# Patient Record
Sex: Female | Born: 1990 | Race: White | Hispanic: No | Marital: Married | Smoking: Never smoker
Health system: Southern US, Community
[De-identification: ages and names within clinical notes are randomized; demographics above are authoritative.]

## PROBLEM LIST (undated history)

## (undated) ENCOUNTER — Inpatient Hospital Stay (HOSPITAL_COMMUNITY): Payer: Self-pay

## (undated) DIAGNOSIS — R519 Headache, unspecified: Secondary | ICD-10-CM

## (undated) DIAGNOSIS — K219 Gastro-esophageal reflux disease without esophagitis: Secondary | ICD-10-CM

## (undated) DIAGNOSIS — T7840XA Allergy, unspecified, initial encounter: Secondary | ICD-10-CM

## (undated) DIAGNOSIS — G43909 Migraine, unspecified, not intractable, without status migrainosus: Secondary | ICD-10-CM

## (undated) DIAGNOSIS — K121 Other forms of stomatitis: Secondary | ICD-10-CM

## (undated) DIAGNOSIS — N83209 Unspecified ovarian cyst, unspecified side: Secondary | ICD-10-CM

## (undated) DIAGNOSIS — F419 Anxiety disorder, unspecified: Secondary | ICD-10-CM

## (undated) DIAGNOSIS — R51 Headache: Secondary | ICD-10-CM

## (undated) HISTORY — DX: Other forms of stomatitis: K12.1

## (undated) HISTORY — DX: Gastro-esophageal reflux disease without esophagitis: K21.9

## (undated) HISTORY — DX: Anxiety disorder, unspecified: F41.9

## (undated) HISTORY — DX: Allergy, unspecified, initial encounter: T78.40XA

## (undated) HISTORY — PX: DILATION AND CURETTAGE OF UTERUS: SHX78

## (undated) HISTORY — DX: Unspecified ovarian cyst, unspecified side: N83.209

---

## 2011-12-19 ENCOUNTER — Ambulatory Visit (INDEPENDENT_AMBULATORY_CARE_PROVIDER_SITE_OTHER): Payer: BC Managed Care – PPO | Admitting: Emergency Medicine

## 2011-12-19 VITALS — BP 96/66 | HR 68 | Temp 98.9°F | Resp 12 | Ht 65.5 in | Wt 149.0 lb

## 2011-12-19 DIAGNOSIS — J019 Acute sinusitis, unspecified: Secondary | ICD-10-CM

## 2011-12-19 DIAGNOSIS — J329 Chronic sinusitis, unspecified: Secondary | ICD-10-CM

## 2011-12-19 DIAGNOSIS — J029 Acute pharyngitis, unspecified: Secondary | ICD-10-CM

## 2011-12-19 LAB — POCT RAPID STREP A (OFFICE): Rapid Strep A Screen: NEGATIVE

## 2011-12-19 MED ORDER — AZITHROMYCIN 250 MG PO TABS: ORAL_TABLET | ORAL | Status: AC

## 2011-12-19 MED ORDER — AZELASTINE HCL 0.1 % NA SOLN: 1.0000 | Freq: Two times a day (BID) | NASAL | Status: DC

## 2011-12-19 MED ORDER — AMOXICILLIN 875 MG PO TABS: 875.0000 mg | ORAL_TABLET | Freq: Two times a day (BID) | ORAL | Status: DC

## 2011-12-19 NOTE — Progress Notes (Signed)
  Subjective:    Patient ID: Anna Park, female    DOB: Mar 15, 1991, 21 y.o.   MRN: [id number]  HPI patient presents with a history that approximately 2 weeks ago she had an upper respiratory type infection. Following this she seemed to improve but in the last 2-3 days she has developed a sore throat cough headache and extreme fatigue. She is having a lot of discomfort interferes with a green nasal discharge. She does have a history of sinus infections    Review of Systems the patient is overall in good health with no medical problems.     Objective:   Physical Exam physical exam reveals an alert cooperative female who is in no acute distress. Her TMs are clear her nose is congestion the throat is red and inflamed the neck is supple without adenopathy the chest is clear to auscultation and percussion        Assessment & Plan:  Assessment is that the patient had an upper respiratory infection approximately 2 weeks ago. She is now bothered with symptoms of sinusitis. We'll check a rapid strep test rule out strep pharyngitis.

## 2011-12-19 NOTE — Patient Instructions (Signed)
Sinusitis Sinusitis an infection of the air pockets (sinuses) in your face. This can cause puffiness (swelling). It can also cause drainage from your sinuses.   HOME CARE    Only take medicine as told by your doctor.     Drink enough fluids to keep your pee (urine) clear or pale yellow.     Apply moist heat or ice packs for pain relief.     Use salt (saline) nose sprays. The spray will wet the thick fluid in the nose. This can help the sinuses drain.  GET HELP RIGHT AWAY IF:    You have a fever.     Your baby is older than 3 months with a rectal temperature of 102 F (38.9 C) or higher.     Your baby is 35 months old or younger with a rectal temperature of 100.4 F (38 C) or higher.     The pain gets worse.     You get a very bad headache.     You keep throwing up (vomiting).     Your face gets puffy.  MAKE SURE YOU:    Understand these instructions.     Will watch your condition.     Will get help right away if you are not doing well or get worse.  Document Released: 04/13/2008 Document Revised: 07/08/2011 Document Reviewed: 04/13/2008 [facility] Patient Information 2012 [place], [place].

## 2013-02-03 ENCOUNTER — Encounter (HOSPITAL_BASED_OUTPATIENT_CLINIC_OR_DEPARTMENT_OTHER): Payer: Self-pay

## 2013-02-03 ENCOUNTER — Emergency Department (HOSPITAL_BASED_OUTPATIENT_CLINIC_OR_DEPARTMENT_OTHER)
Admission: EM | Admit: 2013-02-03 | Discharge: 2013-02-03 | Disposition: A | Discharge status: Home / Self Care | Payer: BC Managed Care – PPO | Attending: Emergency Medicine | Admitting: Emergency Medicine

## 2013-02-03 ENCOUNTER — Emergency Department (HOSPITAL_BASED_OUTPATIENT_CLINIC_OR_DEPARTMENT_OTHER): Payer: BC Managed Care – PPO

## 2013-02-03 DIAGNOSIS — R42 Dizziness and giddiness: Secondary | ICD-10-CM | POA: Insufficient documentation

## 2013-02-03 DIAGNOSIS — R110 Nausea: Secondary | ICD-10-CM | POA: Insufficient documentation

## 2013-02-03 DIAGNOSIS — R209 Unspecified disturbances of skin sensation: Secondary | ICD-10-CM | POA: Insufficient documentation

## 2013-02-03 DIAGNOSIS — R0602 Shortness of breath: Secondary | ICD-10-CM | POA: Insufficient documentation

## 2013-02-03 DIAGNOSIS — Z3202 Encounter for pregnancy test, result negative: Secondary | ICD-10-CM | POA: Insufficient documentation

## 2013-02-03 DIAGNOSIS — R079 Chest pain, unspecified: Secondary | ICD-10-CM | POA: Insufficient documentation

## 2013-02-03 DIAGNOSIS — Z79899 Other long term (current) drug therapy: Secondary | ICD-10-CM | POA: Insufficient documentation

## 2013-02-03 DIAGNOSIS — Z8679 Personal history of other diseases of the circulatory system: Secondary | ICD-10-CM | POA: Insufficient documentation

## 2013-02-03 DIAGNOSIS — F411 Generalized anxiety disorder: Secondary | ICD-10-CM | POA: Insufficient documentation

## 2013-02-03 DIAGNOSIS — F419 Anxiety disorder, unspecified: Secondary | ICD-10-CM

## 2013-02-03 DIAGNOSIS — R002 Palpitations: Secondary | ICD-10-CM | POA: Insufficient documentation

## 2013-02-03 DIAGNOSIS — R064 Hyperventilation: Secondary | ICD-10-CM

## 2013-02-03 HISTORY — DX: Migraine, unspecified, not intractable, without status migrainosus: G43.909

## 2013-02-03 LAB — CBC
HCT: 41.7 % (ref 36.0–46.0)
MCHC: 35 g/dL (ref 30.0–36.0)
RDW: 12.1 % (ref 11.5–15.5)

## 2013-02-03 LAB — BASIC METABOLIC PANEL
BUN: 13 mg/dL (ref 6–23)
Creatinine, Ser: 0.6 mg/dL (ref 0.50–1.10)
GFR calc Af Amer: >90 mL/min (ref >90)
GFR calc non Af Amer: >90 mL/min (ref >90)
Potassium: 3.4 mEq/L — ABNORMAL LOW (ref 3.5–5.1)

## 2013-02-03 LAB — URINE MICROSCOPIC-ADD ON

## 2013-02-03 LAB — URINALYSIS, ROUTINE W REFLEX MICROSCOPIC
Bilirubin Urine: NEGATIVE
Hgb urine dipstick: NEGATIVE
Protein, ur: NEGATIVE mg/dL
Urobilinogen, UA: 0.2 mg/dL (ref 0.0–1.0)

## 2013-02-03 MED ORDER — ASPIRIN 325 MG PO TABS
325.0000 mg | ORAL_TABLET | ORAL | Status: AC
  Administered 2013-02-03: 325 mg via ORAL
  Filled 2013-02-03: qty 1

## 2013-02-03 MED ORDER — ALPRAZOLAM 0.25 MG PO TABS: ORAL_TABLET | ORAL | Status: DC

## 2013-02-03 NOTE — ED Notes (Deleted)
Pt states that she was trying to take some scissors away from her toddler, and she states that the scissors cut her L index finger.  Bleeding controlled.

## 2013-02-03 NOTE — ED Provider Notes (Signed)
History     CSN: [id number]  Arrival date & time 02/03/13  1606   First MD Initiated Contact with Patient 02/03/13 1639      Chief Complaint  Patient presents with  . Chest Pain  . Nausea    (Consider location/radiation/quality/duration/timing/severity/associated sxs/prior treatment) Patient is a 22 y.o. female presenting with chest pain. The history is provided by the patient. No language interpreter was used.  Chest Pain Pain location:  Substernal area and L chest Pain quality: sharp and stabbing   Pain radiates to:  L arm Pain radiates to the back: no   Pain severity:  Severe Duration:  2 weeks Timing:  Constant Progression:  Worsening Chronicity:  New Context: breathing, at rest and stress   Relieved by:  Nothing Worsened by:  Nothing tried Ineffective treatments:  None tried Associated symptoms: numbness and palpitations   Risk factors: no diabetes mellitus and no hypertension   Pt reports she is dizzy,  Chest pain for 2 weeks.  Face feels tingly,  Short of breath.   Pt complains of feeling sick.  Pt reports she feels short of breath.   Past Medical History  Diagnosis Date  . Migraines     History reviewed. No pertinent past surgical history.  History reviewed. No pertinent family history.  History  Substance Use Topics  . Smoking status: Never Smoker   . Smokeless tobacco: Never Used  . Alcohol Use: Yes     Comment: social    OB History   Grav Para Term Preterm Abortions TAB SAB Ect Mult Living                  Review of Systems  Cardiovascular: Positive for chest pain and palpitations.  Neurological: Positive for numbness.  All other systems reviewed and are negative.    Allergies  Sulfa antibiotics  Home Medications   Current Outpatient Rx  Name  Route  Sig  Dispense  Refill  . EXPIRED: azelastine (ASTELIN) 137 MCG/SPRAY nasal spray   Nasal   Place 1 spray into the nose 2 (two) times daily. Use in each nostril as directed   30 mL  12   . Topiramate (TOPAMAX PO)   Oral   Take by mouth daily.           BP 135/82  Pulse 76  Temp(Src) 98.2 F (36.8 C) (Oral)  Resp 20  Ht 5\' 6"  (1.676 m)  Wt 150 lb (68.04 kg)  BMI 24.22 kg/m2  SpO2 100%  LMP 01/11/2013  Physical Exam  Nursing note and vitals reviewed. Constitutional: She is oriented to person, place, and time. She appears well-developed and well-nourished.  HENT:  Head: Normocephalic and atraumatic.  Right Ear: External ear normal.  Left Ear: External ear normal.  Mouth/Throat: Oropharynx is clear and moist.  Eyes: Conjunctivae and EOM are normal. Pupils are equal, round, and reactive to light.  Neck: Normal range of motion. Neck supple.  Cardiovascular: Normal rate.   Pulmonary/Chest: Effort normal.  Abdominal: Soft.  Neurological: She is alert and oriented to person, place, and time. She has normal reflexes.  Skin: Skin is warm.  Psychiatric: She has a normal mood and affect.    ED Course  Procedures (including critical care time)  Labs Reviewed  CBC - Abnormal; Notable for the following:    WBC 11.1 (*)    All other components within normal limits  BASIC METABOLIC PANEL - Abnormal; Notable for the following:    Potassium 3.4 (*)  All other components within normal limits  URINALYSIS, ROUTINE W REFLEX MICROSCOPIC - Abnormal; Notable for the following:    APPearance CLOUDY (*)    Leukocytes, UA SMALL (*)    All other components within normal limits  URINE MICROSCOPIC-ADD ON - Abnormal; Notable for the following:    Squamous Epithelial / LPF FEW (*)    Bacteria, UA FEW (*)    All other components within normal limits  URINE CULTURE  TROPONIN I  PREGNANCY, URINE  D-DIMER, QUANTITATIVE   Dg Chest 2 View  02/03/2013  *RADIOLOGY REPORT*  Clinical Data: Shortness of breath for several weeks  CHEST - 2 VIEW  Comparison: None  Findings: Normal heart size, mediastinal contours, and pulmonary vascularity. Lungs clear. Bones unremarkable. No  pneumothorax.  IMPRESSION: No acute abnormalities.   Original Report Authenticated By: Ulyses Southward, M.D.     Date: 02/03/2013  Rate: 79  Rhythm: normal sinus rhythm  QRS Axis: normal  Intervals: normal  ST/T Wave abnormalities: normal  Conduction Disutrbances:none  Narrative Interpretation:   Old EKG Reviewed: none available   No diagnosis found.  RN reports pt had an episode of numbness and trouble breathing.  I evaluated pt at time,  Pt hyperventilating,  Pt reassured and was able to slow breathing.  MDM  Pt has normal exam.  Normal labs.   I suspect stress.   Pt given referrals.   I counseled on hyperventilating.  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  Pt advised primary care followup,  Counseling referral.           Elson Areas, PA-C 02/03/13 2037

## 2013-02-03 NOTE — ED Notes (Signed)
Pt states that she has severe L sided chest pain x2 weeks, intermittently.  Pt states that about 1230 today she had onset of CP to L chest above and below breast.  Pt states that she is also nausea, and has headache.  Hx of migraines.

## 2013-02-03 NOTE — ED Notes (Signed)
She has been extremely thirsty, headache, and left sided chest pain. Feels shaky.

## 2013-02-03 NOTE — ED Notes (Signed)
Pt states that she is feeling much better.

## 2013-02-03 NOTE — ED Notes (Signed)
Called to patient's room by patient, who states that she is dizzy and her face is tingling and she feels short of breath.  Pt is in nad.  Pt is flushed, but no swelling or angio edema noted.

## 2013-02-03 NOTE — ED Provider Notes (Signed)
Medical screening examination/treatment/procedure(s) were performed by non-physician practitioner and as supervising physician I was immediately available for consultation/collaboration.   Celene Kras, MD 02/03/13 [phone]

## 2013-02-05 LAB — URINE CULTURE: Colony Count: 2000

## 2013-04-17 ENCOUNTER — Ambulatory Visit (INDEPENDENT_AMBULATORY_CARE_PROVIDER_SITE_OTHER): Payer: BC Managed Care – PPO | Admitting: Family Medicine

## 2013-04-17 VITALS — BP 108/80 | HR 64 | Temp 98.8°F | Resp 20 | Ht 65.25 in | Wt 169.4 lb

## 2013-04-17 DIAGNOSIS — J039 Acute tonsillitis, unspecified: Secondary | ICD-10-CM

## 2013-04-17 MED ORDER — PENICILLIN V POTASSIUM 500 MG PO TABS: 500.0000 mg | ORAL_TABLET | Freq: Three times a day (TID) | ORAL | Status: DC

## 2013-04-17 NOTE — Progress Notes (Signed)
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  No known fever.  No fatigue.  Objective:  NAD HEENT:  Bilateral tonsillar swelling with exudates, otherwise negative Chest:  Clear Neck: supple with mildly swollen bilateral anterior cervical nodes Abdomen:  Soft with no HSM or tenderness  Assessment:  Tonsillitis

## 2013-04-17 NOTE — Patient Instructions (Addendum)

## 2015-08-19 LAB — OB RESULTS CONSOLE GBS
GBS: POSITIVE
GBS: POSITIVE

## 2015-08-19 LAB — OB RESULTS CONSOLE RPR: RPR: NONREACTIVE

## 2015-08-19 LAB — OB RESULTS CONSOLE ABO/RH: RH Type: POSITIVE

## 2015-08-19 LAB — OB RESULTS CONSOLE ANTIBODY SCREEN: Antibody Screen: NEGATIVE

## 2015-08-19 LAB — OB RESULTS CONSOLE HIV ANTIBODY (ROUTINE TESTING): HIV: NONREACTIVE

## 2015-08-19 LAB — OB RESULTS CONSOLE HEPATITIS B SURFACE ANTIGEN: Hepatitis B Surface Ag: NEGATIVE

## 2015-08-19 LAB — OB RESULTS CONSOLE RUBELLA ANTIBODY, IGM: Rubella: NON-IMMUNE/NOT IMMUNE

## 2015-09-04 LAB — OB RESULTS CONSOLE GC/CHLAMYDIA
Chlamydia: NEGATIVE
Gonorrhea: NEGATIVE

## 2015-11-10 ENCOUNTER — Encounter (HOSPITAL_COMMUNITY): Payer: Self-pay | Admitting: *Deleted

## 2015-11-10 ENCOUNTER — Inpatient Hospital Stay (HOSPITAL_COMMUNITY)
Admission: AD | Admit: 2015-11-10 | Discharge: 2015-11-10 | Disposition: A | Discharge status: Home / Self Care | Payer: BC Managed Care – PPO | Source: Ambulatory Visit | Attending: Obstetrics & Gynecology | Admitting: Obstetrics & Gynecology

## 2015-11-10 DIAGNOSIS — O4692 Antepartum hemorrhage, unspecified, second trimester: Secondary | ICD-10-CM | POA: Diagnosis not present

## 2015-11-10 DIAGNOSIS — Z3A20 20 weeks gestation of pregnancy: Secondary | ICD-10-CM | POA: Insufficient documentation

## 2015-11-10 DIAGNOSIS — O26852 Spotting complicating pregnancy, second trimester: Secondary | ICD-10-CM | POA: Diagnosis not present

## 2015-11-10 DIAGNOSIS — Z0189 Encounter for other specified special examinations: Secondary | ICD-10-CM | POA: Diagnosis present

## 2015-11-10 DIAGNOSIS — Z7289 Other problems related to lifestyle: Secondary | ICD-10-CM | POA: Diagnosis not present

## 2015-11-10 HISTORY — DX: Headache, unspecified: R51.9

## 2015-11-10 HISTORY — DX: Headache: R51

## 2015-11-10 LAB — WET PREP, GENITAL
Clue Cells Wet Prep HPF POC: NONE SEEN
Sperm: NONE SEEN
Trich, Wet Prep: NONE SEEN
Yeast Wet Prep HPF POC: NONE SEEN

## 2015-11-10 LAB — CBC
HCT: 35.3 % — ABNORMAL LOW (ref 36.0–46.0)
Hemoglobin: 12 g/dL (ref 12.0–15.0)
MCH: 31.1 pg (ref 26.0–34.0)
MCHC: 34 g/dL (ref 30.0–36.0)
MCV: 91.5 fL (ref 78.0–100.0)
Platelets: 246 10*3/uL (ref 150–400)
RBC: 3.86 MIL/uL — ABNORMAL LOW (ref 3.87–5.11)
RDW: 13.8 % (ref 11.5–15.5)
WBC: 11.8 10*3/uL — ABNORMAL HIGH (ref 4.0–10.5)

## 2015-11-10 LAB — URINALYSIS, ROUTINE W REFLEX MICROSCOPIC
Bilirubin Urine: NEGATIVE
Glucose, UA: NEGATIVE mg/dL
Hgb urine dipstick: NEGATIVE
Ketones, ur: NEGATIVE mg/dL
Leukocytes, UA: NEGATIVE
Nitrite: NEGATIVE
Protein, ur: NEGATIVE mg/dL
Specific Gravity, Urine: 1.02 (ref 1.005–1.030)
pH: 6 (ref 5.0–8.0)

## 2015-11-10 NOTE — Discharge Instructions (Signed)
Round Ligament Pain During Pregnancy Round ligament pain is a sharp pain or jabbing feeling often felt in the lower belly or groin area on one or both sides. It is one of the most common complaints during pregnancy and is considered a normal part of pregnancy. It is most often felt during the second trimester.  Here is what you need to know about round ligament pain, including some tips to help you feel better.  Causes of Round Ligament Pain  Several thick ligaments surround and support your womb (uterus) as it grows during pregnancy. One of them is called the round ligament.  The round ligament connects the front part of the womb to your groin, the area where your legs attach to your pelvis. The round ligament normally tightens and relaxes slowly.  As your baby and womb grow, the round ligament stretches. That makes it more likely to become strained.  Sudden movements can cause the ligament to tighten quickly, like a rubber band snapping. This causes a sudden and quick jabbing feeling.  Symptoms of Round Ligament Pain  Round ligament pain can be concerning and uncomfortable. But it is considered normal as your body changes during pregnancy.  The symptoms of round ligament pain include a sharp, sudden spasm in the belly. It usually affects the right side, but it may happen on both sides. The pain only lasts a few seconds.  Exercise may cause the pain, as will rapid movements such as:  sneezing coughing laughing rolling over in bed standing up too quickly  Treatment of Round Ligament Pain  Here are some tips that may help reduce your discomfort:  Pain relief. Take over-the-counter acetaminophen for pain, if necessary. Ask your doctor if this is OK.  Exercise. Get plenty of exercise to keep your stomach (core) muscles strong. Doing stretching exercises or prenatal yoga can be helpful. Ask your doctor which exercises are safe for you and your baby.  A helpful exercise involves  putting your hands and knees on the floor, lowering your head, and pushing your backside into the air.  Avoid sudden movements. Change positions slowly (such as standing up or sitting down) to avoid sudden movements that may cause stretching and pain.  Flex your hips. Bend and flex your hips before you cough, sneeze, or laugh to avoid pulling on the ligaments.  Apply warmth. A heating pad or warm bath may be helpful. Ask your doctor if this is OK. Extreme heat can be dangerous to the baby.  You should try to modify your daily activity level and avoid positions that may worsen the condition.  When to Call the Doctor/Midwife  Always tell your doctor or midwife about any type of pain you have during pregnancy. Round ligament pain is quick and doesn't last long.  Call your health care provider immediately if you have:  severe pain fever chills pain on urination difficulty walking  Belly pain during pregnancy can be due to many different causes. It is important for your doctor to rule out more serious conditions, including pregnancy complications such as placenta abruption or non-pregnancy illnesses such as:  inguinal hernia appendicitis stomach, liver, and kidney problems Preterm labor pains may sometimes be mistaken for round ligament pain.  Vaginal Bleeding During Pregnancy, Second Trimester A small amount of bleeding (spotting) from the vagina is relatively common in pregnancy. It usually stops on its own. Various things can cause bleeding or spotting in pregnancy. Some bleeding may be related to the pregnancy, and some may not.  Sometimes the bleeding is normal and is not a problem. However, bleeding can also be a sign of something serious. Be sure to tell your health care provider about any vaginal bleeding right away. Some possible causes of vaginal bleeding during the second trimester include:  Infection, inflammation, or growths on the cervix.   The placenta may be partially or  completely covering the opening of the cervix inside the uterus (placenta previa).  The placenta may have separated from the uterus (abruption of the placenta).   You may be having early (preterm) labor.   The cervix may not be strong enough to keep a baby inside the uterus (cervical insufficiency).   Tiny cysts may have developed in the uterus instead of pregnancy tissue (molar pregnancy). HOME CARE INSTRUCTIONS  Watch your condition for any changes. The following actions may help to lessen any discomfort you are feeling:  Follow your health care provider's instructions for limiting your activity. If your health care provider orders bed rest, you may need to stay in bed and only get up to use the bathroom. However, your health care provider may allow you to continue light activity.  If needed, make plans for someone to help with your regular activities and responsibilities while you are on bed rest.  Keep track of the number of pads you use each day, how often you change pads, and how soaked (saturated) they are. Write this down.  Do not use tampons. Do not douche.  Do not have sexual intercourse or orgasms until approved by your health care provider.  If you pass any tissue from your vagina, save the tissue so you can show it to your health care provider.  Only take over-the-counter or prescription medicines as directed by your health care provider.  Do not take aspirin because it can make you bleed.  Do not exercise or perform any strenuous activities or heavy lifting without your health care provider's permission.  Keep all follow-up appointments as directed by your health care provider. SEEK MEDICAL CARE IF:  You have any vaginal bleeding during any part of your pregnancy.  You have cramps or labor pains.  You have a fever, not controlled by medicine. SEEK IMMEDIATE MEDICAL CARE IF:   You have severe cramps in your back or belly (abdomen).  You have  contractions.  You have chills.  You pass large clots or tissue from your vagina.  Your bleeding increases.  You feel light-headed or weak, or you have fainting episodes.  You are leaking fluid or have a gush of fluid from your vagina. MAKE SURE YOU:  Understand these instructions.  Will watch your condition.  Will get help right away if you are not doing well or get worse.   This information is not intended to replace advice given to you by your health care provider. Make sure you discuss any questions you have with your health care provider.   Document Released: 08/05/2005 Document Revised: 10/31/2013 Document Reviewed: 07/03/2013 Elsevier Interactive Patient Education [employer]

## 2015-11-10 NOTE — L&D Delivery Note (Signed)
Operative Delivery Note At 9:15 AM a viable female was delivered via .  Presentation: vertex; Position: Right,, Occiput,, Anterior; Station: +3.  Verbal consent: obtained from patient.  Risks and benefits discussed in detail.  Risks include, but are not limited to the risks of anesthesia, bleeding, infection, damage to maternal tissues, fetal cephalhematoma.  There is also the risk of inability to effect vaginal delivery of the head, or shoulder dystocia that cannot be resolved by established maneuvers, leading to the need for emergency cesarean section.  APGAR: 9,9 ; weight tpending  .   Placenta status spontaneously with 3 vessel cord: , .   Cord:  with the following complications: Short  cord.  Cord pH: not obtained  Anesthesia:epidural Instruments: mushroom Episiotomy:  none Lacerations:2nd   Suture Repair: 2.0 3.0 chromic vicryl Est. Blood Loss (mL):  300  Mom to postpartum.  Baby to Couplet care / Skin to Skin.  Guerino Caporale L 03/24/2016, 9:32 AM

## 2015-11-10 NOTE — MAU Provider Note (Signed)
History     CSN: [id number]  Arrival date and time: 11/10/15 0510   First Provider Initiated Contact with Patient 11/10/15 [phone]      Chief Complaint  Patient presents with  . Vaginal Bleeding   HPI Comments: Anna Park is a 25 y.o. G2P0010 at [redacted]w[redacted]d who presents today with spotting. She states that she awoke at 0330, and had some "tiny dots of blood when wiping". She states that she had an [place] in the office at 18 weeks. She was told all was normal at that time, and that the placenta was "up high". She denies any LOF, and reports feeling fetal movement.  Her next OB appointment is 11/20/15.   Vaginal Bleeding The patient's primary symptoms include vaginal bleeding. The patient's pertinent negatives include no genital itching. This is a new problem. The current episode started today (0330). The problem occurs intermittently. The problem has been unchanged. The pain is mild. The problem affects both sides. She is pregnant. Associated symptoms include abdominal pain. Pertinent negatives include no chills, constipation, diarrhea, dysuria, fever, frequency, nausea, urgency or vomiting. The vaginal discharge was normal. The vaginal bleeding is spotting ("little dots when wiping"). She has not been passing clots. She has not been passing tissue. Nothing aggravates the symptoms. She has tried nothing for the symptoms. Sexual activity: No recent intercourse    Past Medical History  Diagnosis Date  . Headache     Past Surgical History  Procedure Laterality Date  . Dilation and curettage of uterus      History reviewed. No pertinent family history.  Social History  Substance Use Topics  . Smoking status: Never Smoker   . Smokeless tobacco: None  . Alcohol Use: Yes     Comment: DRINK  OCC- NONE  DURING  PREG    Allergies:  Allergies  Allergen Reactions  . Sulfa Antibiotics Hives    Prescriptions prior to admission  Medication Sig Dispense Refill Last Dose  .  HYDROcodone-acetaminophen (NORCO/VICODIN) 5-325 MG tablet Take 1 tablet by mouth every 6 (six) hours as needed for moderate pain.   Past Month at Unknown time  . Prenatal Vit-Fe Fumarate-FA (PRENATAL MULTIVITAMIN) TABS tablet Take 1 tablet by mouth daily at 12 noon.   11/09/2015 at Unknown time  . promethazine (PHENERGAN) 25 MG tablet Take 25 mg by mouth every 6 (six) hours as needed for nausea or vomiting.   More than a month at Unknown time    Review of Systems  Constitutional: Negative for fever and chills.  Gastrointestinal: Positive for abdominal pain. Negative for nausea, vomiting, diarrhea and constipation.  Genitourinary: Positive for vaginal bleeding. Negative for dysuria, urgency and frequency.   Physical Exam   Blood pressure 128/76, pulse 89, temperature 98 F (36.7 C), resp. rate 18, height 5\' 6"  (1.676 m), weight 86.909 kg (191 lb 9.6 oz).  Physical Exam  Nursing note and vitals reviewed. Constitutional: She is oriented to person, place, and time. She appears well-developed and well-nourished. No distress.  HENT:  Head: Normocephalic.  Cardiovascular: Normal rate.   Respiratory: Effort normal.  GI: Soft. There is no tenderness. There is no rebound.  Genitourinary:   External: no lesion Vagina: small amount of white discharge. No bleeding seen  Cervix: pink, smooth, no CMT. Closed/thick/high/posterior  Uterus: AGA, FHT 150 with doppler    Neurological: She is alert and oriented to person, place, and time.  Skin: Skin is warm and dry.  Psychiatric: She has a normal mood and affect.  Results for orders placed or performed during the hospital encounter of 11/10/15 (from the past 24 hour(s))  Urinalysis, Routine w reflex microscopic ([facility])     Status: None   Collection Time: 11/10/15  5:30 AM  Result Value Ref Range   Color, Urine YELLOW YELLOW   APPearance CLEAR CLEAR   Specific Gravity, Urine 1.020 1.005 - 1.030   pH 6.0 5.0 - 8.0   Glucose, UA NEGATIVE  NEGATIVE mg/dL   Hgb urine dipstick NEGATIVE NEGATIVE   Bilirubin Urine NEGATIVE NEGATIVE   Ketones, ur NEGATIVE NEGATIVE mg/dL   Protein, ur NEGATIVE NEGATIVE mg/dL   Nitrite NEGATIVE NEGATIVE   Leukocytes, UA NEGATIVE NEGATIVE  CBC     Status: Abnormal   Collection Time: 11/10/15  5:58 AM  Result Value Ref Range   WBC 11.8 (H) 4.0 - 10.5 K/uL   RBC 3.86 (L) 3.87 - 5.11 MIL/uL   Hemoglobin 12.0 12.0 - 15.0 g/dL   HCT 96.035.3 (L) 45.436.0 - 09.846.0 %   MCV 91.5 78.0 - 100.0 fL   MCH 31.1 26.0 - 34.0 pg   MCHC 34.0 30.0 - 36.0 g/dL   RDW 11.913.8 14.711.5 - 82.915.5 %   Platelets 246 150 - 400 K/uL  Wet prep, genital     Status: Abnormal   Collection Time: 11/10/15  6:20 AM  Result Value Ref Range   Yeast Wet Prep HPF POC NONE SEEN NONE SEEN   Trich, Wet Prep NONE SEEN NONE SEEN   Clue Cells Wet Prep HPF POC NONE SEEN NONE SEEN   WBC, Wet Prep HPF POC MODERATE (A) NONE SEEN   Sperm NONE SEEN     MAU Course  Procedures  MDM [id number]: D/W Dr. Langston MaskerMorris, ok for dc home.   Assessment and Plan   1. Spotting affecting pregnancy in second trimester    DC home Comfort measures reviewed  2nd Trimester precautions  Bleeding precautions PTL precautions  Fetal kick counts RX: none  Return to MAU as needed FU with OB as planned  Follow-up Information    Follow up with Turner DanielsLOWE,DAVID C, MD.   Specialty:  Obstetrics and Gynecology   Why:  As scheduled   Contact information:   [address], [address] [place] [phone]         Tawnya CrookHogan, Heather Donovan 11/10/2015, 6:17 AM

## 2015-11-10 NOTE — MAU Note (Signed)
Awoke about 0330 and went to BR and saw dime-size amt bright blood on tissue. Slight pain in lower abd

## 2015-11-10 NOTE — MAU Note (Signed)
PT  SAYS AT 0330-      SHE  WENT  TO B-ROOM  AND  WIPED  - BRIGHT  RED   DOTS  ON  PAPER.- NONE  ON UNDERWEAR    .  THEN HERE IN  MAU-   TO B-ROOM-  WHEN  WIPED    SAW  BRIGHT  RED   DOTS  ON PAPER.     FEELS  CRAMPING -  STARTED  ON WAY   TO HOSPITAL.      SHE  HAD   PAIN ON HER  RIGHT  SIDE  WHEN  SHE WENT  TO BED.      PAIN IS  SAME  NOW.     LAST SEX-     1-2  WEEKS   AGO.         ALL BEEN  GOOD    WITH PREG.

## 2016-01-04 ENCOUNTER — Inpatient Hospital Stay (HOSPITAL_COMMUNITY): Payer: BC Managed Care – PPO

## 2016-01-04 ENCOUNTER — Encounter (HOSPITAL_COMMUNITY): Payer: Self-pay

## 2016-01-04 ENCOUNTER — Inpatient Hospital Stay (HOSPITAL_COMMUNITY)
Admission: EM | Admit: 2016-01-04 | Discharge: 2016-01-04 | Disposition: A | Discharge status: Home / Self Care | Payer: BC Managed Care – PPO | Source: Ambulatory Visit | Attending: Obstetrics and Gynecology | Admitting: Obstetrics and Gynecology

## 2016-01-04 DIAGNOSIS — O9989 Other specified diseases and conditions complicating pregnancy, childbirth and the puerperium: Secondary | ICD-10-CM | POA: Diagnosis not present

## 2016-01-04 DIAGNOSIS — M549 Dorsalgia, unspecified: Secondary | ICD-10-CM | POA: Diagnosis present

## 2016-01-04 DIAGNOSIS — N1330 Unspecified hydronephrosis: Secondary | ICD-10-CM | POA: Diagnosis not present

## 2016-01-04 DIAGNOSIS — R319 Hematuria, unspecified: Secondary | ICD-10-CM | POA: Diagnosis not present

## 2016-01-04 DIAGNOSIS — R109 Unspecified abdominal pain: Secondary | ICD-10-CM | POA: Diagnosis not present

## 2016-01-04 DIAGNOSIS — O26899 Other specified pregnancy related conditions, unspecified trimester: Secondary | ICD-10-CM

## 2016-01-04 DIAGNOSIS — O26892 Other specified pregnancy related conditions, second trimester: Secondary | ICD-10-CM | POA: Diagnosis not present

## 2016-01-04 DIAGNOSIS — R8271 Bacteriuria: Secondary | ICD-10-CM | POA: Diagnosis present

## 2016-01-04 DIAGNOSIS — Z3A28 28 weeks gestation of pregnancy: Secondary | ICD-10-CM | POA: Diagnosis not present

## 2016-01-04 LAB — CBC
HCT: 32.9 % — ABNORMAL LOW (ref 36.0–46.0)
Hemoglobin: 11.3 g/dL — ABNORMAL LOW (ref 12.0–15.0)
MCH: 31 pg (ref 26.0–34.0)
MCHC: 34.3 g/dL (ref 30.0–36.0)
MCV: 90.4 fL (ref 78.0–100.0)
Platelets: 221 10*3/uL (ref 150–400)
RBC: 3.64 MIL/uL — ABNORMAL LOW (ref 3.87–5.11)
RDW: 13.4 % (ref 11.5–15.5)
WBC: 11.2 10*3/uL — ABNORMAL HIGH (ref 4.0–10.5)

## 2016-01-04 LAB — URINALYSIS, ROUTINE W REFLEX MICROSCOPIC
Bilirubin Urine: NEGATIVE
Glucose, UA: NEGATIVE mg/dL
Ketones, ur: NEGATIVE mg/dL
Leukocytes, UA: NEGATIVE
Nitrite: NEGATIVE
Protein, ur: NEGATIVE mg/dL
Specific Gravity, Urine: 1.015 (ref 1.005–1.030)
pH: 7 (ref 5.0–8.0)

## 2016-01-04 LAB — URINE MICROSCOPIC-ADD ON

## 2016-01-04 MED ORDER — LACTATED RINGERS IV SOLN
INTRAVENOUS | Status: DC
  Administered 2016-01-04: 08:00:00 via INTRAVENOUS

## 2016-01-04 MED ORDER — ACETAMINOPHEN 500 MG PO TABS
1000.0000 mg | ORAL_TABLET | Freq: Four times a day (QID) | ORAL | Status: DC | PRN
Start: 2016-01-04 — End: 2016-03-15

## 2016-01-04 MED ORDER — ACETAMINOPHEN 500 MG PO TABS
1000.0000 mg | ORAL_TABLET | Freq: Once | ORAL | Status: AC
  Administered 2016-01-04: 1000 mg via ORAL
  Filled 2016-01-04: qty 2

## 2016-01-04 NOTE — Progress Notes (Signed)
Off efm to ultrasound by wheelchair.

## 2016-01-04 NOTE — MAU Provider Note (Signed)
History     CSN: [id number]  Arrival date and time: 01/04/16 0619   First Provider Initiated Contact with Patient 01/04/16 [phone]      Chief Complaint  Patient presents with  . Back Pain  . Abdominal Cramping   HPI   AnnaAnna KitchenMarland KitchenLuberta Park is a 25 y.o. female G2P0010 at [redacted]w[redacted]d  Pain started yesterday afternoon; the pain started out as mild and has gotten worse. The pain is sharp at times and other times throbbing/cramping.  The pain starts in the middle of her back and wraps around to the right side of her abdomen.    She has never had this pain before. She currently rates her pain 6/10; she denies the need for pain medication at this time.    + fetal movement  Denies leaking of fluid or bleeding   OB History    Gravida Para Term Preterm AB TAB SAB Ectopic Multiple Living   Past Medical History  Diagnosis Date  . Headache     Past Surgical History  Procedure Laterality Date  . Dilation and curettage of uterus      Family History  Problem Relation Age of Onset  . Hypertension Mother     Social History  Substance Use Topics  . Smoking status: Never Smoker   . Smokeless tobacco: None  . Alcohol Use: Yes     Comment: DRINK  OCC- NONE  DURING  PREG    Allergies:  Allergies  Allergen Reactions  . Sulfa Antibiotics Hives    Prescriptions prior to admission  Medication Sig Dispense Refill Last Dose  . Prenatal Vit-Fe Fumarate-FA (PRENATAL MULTIVITAMIN) TABS tablet Take 1 tablet by mouth daily at 12 noon.   01/03/2016 at Unknown time  . promethazine (PHENERGAN) 25 MG tablet Take 25 mg by mouth every 6 (six) hours as needed for nausea or vomiting.   Past Month at Unknown time  . HYDROcodone-acetaminophen (NORCO/VICODIN) 5-325 MG tablet Take 1 tablet by mouth every 6 (six) hours as needed for moderate pain.   More than a month at Unknown time   Results for orders placed or performed during the hospital encounter of 01/04/16 (from the past 48  hour(s))  Urinalysis, Routine w reflex microscopic (not at [facility])     Status: Abnormal   Collection Time: 01/04/16  6:15 AM  Result Value Ref Range   Color, Urine YELLOW YELLOW   APPearance CLEAR CLEAR   Specific Gravity, Urine 1.015 1.005 - 1.030   pH 7.0 5.0 - 8.0   Glucose, UA NEGATIVE NEGATIVE mg/dL   Hgb urine dipstick LARGE (A) NEGATIVE   Bilirubin Urine NEGATIVE NEGATIVE   Ketones, ur NEGATIVE NEGATIVE mg/dL   Protein, ur NEGATIVE NEGATIVE mg/dL   Nitrite NEGATIVE NEGATIVE   Leukocytes, UA NEGATIVE NEGATIVE  Urine microscopic-add on     Status: Abnormal   Collection Time: 01/04/16  6:15 AM  Result Value Ref Range   Squamous Epithelial / LPF 0-5 (A) NONE SEEN   WBC, UA 0-5 0 - 5 WBC/hpf   RBC / HPF 6-30 0 - 5 RBC/hpf   Bacteria, UA FEW (A) NONE SEEN    Review of Systems  Constitutional: Negative for fever.  Gastrointestinal: Positive for nausea and abdominal pain. Negative for vomiting.  Genitourinary: Negative for dysuria.  Musculoskeletal: Positive for back pain.   Physical Exam   Blood pressure 128/78, pulse 98, temperature 97.5 F (36.4  C), temperature source Oral, resp. rate 16.  Physical Exam  Constitutional: She is oriented to person, place, and time. She appears well-developed and well-nourished. No distress.  HENT:  Head: Normocephalic.  Eyes: Pupils are equal, round, and reactive to light.  Respiratory: Effort normal.  GI: Soft. She exhibits no distension. There is no tenderness. There is no rebound and no CVA tenderness.  Genitourinary:  Dilation: Closed, posterior  Exam by:: Anna Carbon NP  Musculoskeletal: Normal range of motion.  Neurological: She is alert and oriented to person, place, and time.  Skin: Skin is warm. She is not diaphoretic.  Psychiatric: Her behavior is normal.   Fetal Tracing: Baseline: 130 bpm  Variability: moderate  Accelerations: 15x15 Decelerations: One quick variable  Toco: UI with one contraction noted   MAU Course   Procedures None   MDM Urine shows large hemoglobin Lr Bolus PO hydration Renal US  Discussed with Dr. Henderson Cloud @ [phone] He recommended tylenol 1 gram PO   Report given to [place] CNM who resumes care of the patient. Patient is awaiting [place].   Anna Lope, NP  Care of pt assumed by Anna Park, CNM at 0800. CBC, urine culture ordered.  Patient's has improved to 4/10 on pain scale now. Discussed results of ultrasound and labs with Dr. Henderson Cloud. No new orders. Patient very for discharge.  Results for orders placed or performed during the hospital encounter of 01/04/16 (from the past 24 hour(s))  Urinalysis, Routine w reflex microscopic (not at [facility])     Status: Abnormal   Collection Time: 01/04/16  6:15 AM  Result Value Ref Range   Color, Urine YELLOW YELLOW   APPearance CLEAR CLEAR   Specific Gravity, Urine 1.015 1.005 - 1.030   pH 7.0 5.0 - 8.0   Glucose, UA NEGATIVE NEGATIVE mg/dL   Hgb urine dipstick LARGE (A) NEGATIVE   Bilirubin Urine NEGATIVE NEGATIVE   Ketones, ur NEGATIVE NEGATIVE mg/dL   Protein, ur NEGATIVE NEGATIVE mg/dL   Nitrite NEGATIVE NEGATIVE   Leukocytes, UA NEGATIVE NEGATIVE  Urine microscopic-add on     Status: Abnormal   Collection Time: 01/04/16  6:15 AM  Result Value Ref Range   Squamous Epithelial / LPF 0-5 (A) NONE SEEN   WBC, UA 0-5 0 - 5 WBC/hpf   RBC / HPF 6-30 0 - 5 RBC/hpf   Bacteria, UA FEW (A) NONE SEEN  CBC     Status: Abnormal   Collection Time: 01/04/16  8:59 AM  Result Value Ref Range   WBC 11.2 (H) 4.0 - 10.5 K/uL   RBC 3.64 (L) 3.87 - 5.11 MIL/uL   Hemoglobin 11.3 (L) 12.0 - 15.0 g/dL   HCT 96.0 (L) 45.4 - 09.8 %   MCV 90.4 78.0 - 100.0 fL   MCH 31.0 26.0 - 34.0 pg   MCHC 34.3 30.0 - 36.0 g/dL   RDW 11.9 14.7 - 82.9 %   Platelets 221 150 - 400 K/uL   MDM 25 year old female at 36 weeks 5 days gestation with right flank pain and hematuria. No evidence of pyelonephritis. No evidence of kidney stone although stone too  small for visualization on ultrasound still possibility. No evidence of obstruction of ureters. Low suspicion for appendicitis due to location of the pain, absence of abdominal tenderness, negligible leukocytosis and insignificant GI complaints.  Assessment and Plan   1. Abdominal pain affecting pregnancy, antepartum   2. Hematuria    Discharge home in stable condition per consult with Dr.  Tomblin. Push fluids. Extremities Tylenol as needed for pain. Urine culture pending Follow-up Information    Follow up with [facility] On 01/06/2016.   Why:  Call office to schedule an appointment for Monday the 27th or Tuesday the 28th   Contact information:   [address] [place] [address] [phone]       Follow up with [facility] [place] [facility].   Why:  As needed in emergencies (fever greater than 100.4, severe pain, severe nausea and vomiting, preterm labor, vaginal bleeding or leaking of fluid)   Contact information:   [address] [id number] mc [place] [address] [phone]       Medication List    TAKE these medications        acetaminophen 500 MG tablet  Commonly known as:  TYLENOL  Take 2 tablets (1,000 mg total) by mouth every 6 (six) hours as needed for moderate pain.     calcium carbonate 500 MG chewable tablet  Commonly known as:  TUMS - dosed in mg elemental calcium  Chew 1 tablet by mouth daily.     HYDROcodone-acetaminophen 5-325 MG tablet  Commonly known as:  NORCO/VICODIN  Take 1 tablet by mouth every 6 (six) hours as needed for moderate pain.     prenatal multivitamin Tabs tablet  Take 1 tablet by mouth daily at 12 noon.     promethazine 25 MG tablet  Commonly known as:  PHENERGAN  Take 25 mg by mouth every 6 (six) hours as needed for nausea or vomiting.       [place], [place] 01/04/2016 9:26 AM

## 2016-01-04 NOTE — Progress Notes (Signed)
Patient back from ultrasound per [place] CNM d/c efm.

## 2016-01-04 NOTE — Discharge Instructions (Signed)
Abdominal Pain During Pregnancy Abdominal pain is common in pregnancy. Most of the time, it does not cause harm. There are many causes of abdominal pain. Some causes are more serious than others. Some of the causes of abdominal pain in pregnancy are easily diagnosed. Occasionally, the diagnosis takes time to understand. Other times, the cause is not determined. Abdominal pain can be a sign that something is very wrong with the pregnancy, or the pain may have nothing to do with the pregnancy at all. For this reason, always tell your health care provider if you have any abdominal discomfort. HOME CARE INSTRUCTIONS  Monitor your abdominal pain for any changes. The following actions may help to alleviate any discomfort you are experiencing:  Do not have sexual intercourse or put anything in your vagina until your symptoms go away completely.  Get plenty of rest until your pain improves.  Drink clear fluids if you feel nauseous. Avoid solid food as long as you are uncomfortable or nauseous.  Only take over-the-counter or prescription medicine as directed by your health care provider.  Keep all follow-up appointments with your health care provider. SEEK IMMEDIATE MEDICAL CARE IF:  You are bleeding, leaking fluid, or passing tissue from the vagina.  You have increasing pain or cramping.  You have persistent vomiting.  You have painful or bloody urination.  You have a fever.  You notice a decrease in your baby's movements.  You have extreme weakness or feel faint.  You have shortness of breath, with or without abdominal pain.  You develop a severe headache with abdominal pain.  You have abnormal vaginal discharge with abdominal pain.  You have persistent diarrhea.  You have abdominal pain that continues even after rest, or gets worse. MAKE SURE YOU:   Understand these instructions.  Will watch your condition.  Will get help right away if you are not doing well or get worse.     This information is not intended to replace advice given to you by your health care provider. Make sure you discuss any questions you have with your health care provider.   Document Released: 10/26/2005 Document Revised: 08/16/2013 Document Reviewed: 05/25/2013 Elsevier Interactive Patient Education 2016 Elsevier Inc.  Hematuria, Adult Hematuria is blood in your urine. It can be caused by a bladder infection, kidney infection, prostate infection, kidney stone, or cancer of your urinary tract. Infections can usually be treated with medicine, and a kidney stone usually will pass through your urine. If neither of these is the cause of your hematuria, further workup to find out the reason may be needed. It is very important that you tell your health care provider about any blood you see in your urine, even if the blood stops without treatment or happens without causing pain. Blood in your urine that happens and then stops and then happens again can be a symptom of a very serious condition. Also, pain is not a symptom in the initial stages of many urinary cancers. HOME CARE INSTRUCTIONS   Drink lots of fluid, 3-4 quarts a day. If you have been diagnosed with an infection, cranberry juice is especially recommended, in addition to large amounts of water.  Avoid caffeine, tea, and carbonated beverages because they tend to irritate the bladder.  Avoid alcohol because it may irritate the prostate.  Take all medicines as directed by your health care provider.  If you were prescribed an antibiotic medicine, finish it all even if you start to feel better.  If you have  been diagnosed with a kidney stone, follow your health care provider's instructions regarding straining your urine to catch the stone.  Empty your bladder often. Avoid holding urine for long periods of time.  After a bowel movement, women should cleanse front to back. Use each tissue only once.  Empty your bladder before and after  sexual intercourse if you are a female. SEEK MEDICAL CARE IF:  You develop back pain.  You have a fever.  You have a feeling of sickness in your stomach (nausea) or vomiting.  Your symptoms are not better in 3 days. Return sooner if you are getting worse. SEEK IMMEDIATE MEDICAL CARE IF:   You develop severe vomiting and are unable to keep the medicine down.  You develop severe back or abdominal pain despite taking your medicines.  You begin passing a large amount of blood or clots in your urine.  You feel extremely weak or faint, or you pass out. MAKE SURE YOU:   Understand these instructions.  Will watch your condition.  Will get help right away if you are not doing well or get worse.   This information is not intended to replace advice given to you by your health care provider. Make sure you discuss any questions you have with your health care provider.   Document Released: 10/26/2005 Document Revised: 11/16/2014 Document Reviewed: 06/26/2013 Elsevier Interactive Patient Education [employer]

## 2016-01-04 NOTE — MAU Note (Signed)
Yesterday started having abd cramps then woke up this morning with low back pain moved to right side lower abd. Feels like period pain.  Baby moving well.  No bleeding. No leaking.

## 2016-01-06 DIAGNOSIS — R8271 Bacteriuria: Secondary | ICD-10-CM | POA: Diagnosis present

## 2016-01-06 LAB — CULTURE, OB URINE: Special Requests: NORMAL

## 2016-01-09 ENCOUNTER — Telehealth: Payer: Self-pay | Admitting: Family Medicine

## 2016-01-09 NOTE — Telephone Encounter (Signed)
Called talked to patient, asked if she had followed up with [facility], patient stated that she had and will continue her care there.

## 2016-03-15 ENCOUNTER — Inpatient Hospital Stay (HOSPITAL_COMMUNITY)
Admission: AD | Admit: 2016-03-15 | Discharge: 2016-03-15 | Disposition: A | Discharge status: Home / Self Care | Payer: BC Managed Care – PPO | Source: Ambulatory Visit | Attending: Obstetrics and Gynecology | Admitting: Obstetrics and Gynecology

## 2016-03-15 ENCOUNTER — Encounter (HOSPITAL_COMMUNITY): Payer: Self-pay | Admitting: *Deleted

## 2016-03-15 DIAGNOSIS — O4290 Premature rupture of membranes, unspecified as to length of time between rupture and onset of labor, unspecified weeks of gestation: Secondary | ICD-10-CM | POA: Diagnosis present

## 2016-03-15 DIAGNOSIS — Z8249 Family history of ischemic heart disease and other diseases of the circulatory system: Secondary | ICD-10-CM | POA: Diagnosis not present

## 2016-03-15 DIAGNOSIS — R030 Elevated blood-pressure reading, without diagnosis of hypertension: Secondary | ICD-10-CM | POA: Diagnosis not present

## 2016-03-15 DIAGNOSIS — Z3A38 38 weeks gestation of pregnancy: Secondary | ICD-10-CM | POA: Diagnosis not present

## 2016-03-15 DIAGNOSIS — R8271 Bacteriuria: Secondary | ICD-10-CM

## 2016-03-15 DIAGNOSIS — IMO0001 Reserved for inherently not codable concepts without codable children: Secondary | ICD-10-CM

## 2016-03-15 DIAGNOSIS — O133 Gestational [pregnancy-induced] hypertension without significant proteinuria, third trimester: Secondary | ICD-10-CM | POA: Diagnosis not present

## 2016-03-15 LAB — COMPREHENSIVE METABOLIC PANEL
ALT: 11 U/L — ABNORMAL LOW (ref 14–54)
AST: 17 U/L (ref 15–41)
Albumin: 3.1 g/dL — ABNORMAL LOW (ref 3.5–5.0)
Alkaline Phosphatase: 91 U/L (ref 38–126)
Anion gap: 9 (ref 5–15)
BUN: 6 mg/dL (ref 6–20)
CO2: 21 mmol/L — ABNORMAL LOW (ref 22–32)
Calcium: 8.7 mg/dL — ABNORMAL LOW (ref 8.9–10.3)
Chloride: 108 mmol/L (ref 101–111)
Creatinine, Ser: 0.37 mg/dL — ABNORMAL LOW (ref 0.44–1.00)
GFR calc Af Amer: >60 mL/min (ref >60)
GFR calc non Af Amer: >60 mL/min (ref >60)
Glucose, Bld: 82 mg/dL (ref 65–99)
Potassium: 3.6 mmol/L (ref 3.5–5.1)
Sodium: 138 mmol/L (ref 135–145)
Total Bilirubin: 0.1 mg/dL — ABNORMAL LOW (ref 0.3–1.2)
Total Protein: 6.7 g/dL (ref 6.5–8.1)

## 2016-03-15 LAB — PROTEIN / CREATININE RATIO, URINE
Creatinine, Urine: 114 mg/dL
Protein Creatinine Ratio: 0.11 mg/mg{Cre} (ref 0.00–0.15)
Total Protein, Urine: 13 mg/dL

## 2016-03-15 LAB — CBC
HCT: 33 % — ABNORMAL LOW (ref 36.0–46.0)
Hemoglobin: 10.8 g/dL — ABNORMAL LOW (ref 12.0–15.0)
MCH: 29 pg (ref 26.0–34.0)
MCHC: 32.7 g/dL (ref 30.0–36.0)
MCV: 88.5 fL (ref 78.0–100.0)
Platelets: 202 10*3/uL (ref 150–400)
RBC: 3.73 MIL/uL — ABNORMAL LOW (ref 3.87–5.11)
RDW: 14.9 % (ref 11.5–15.5)
WBC: 10.4 10*3/uL (ref 4.0–10.5)

## 2016-03-15 LAB — URINALYSIS, ROUTINE W REFLEX MICROSCOPIC
Bilirubin Urine: NEGATIVE
Glucose, UA: NEGATIVE mg/dL
Hgb urine dipstick: NEGATIVE
Ketones, ur: NEGATIVE mg/dL
Leukocytes, UA: NEGATIVE
Nitrite: NEGATIVE
Protein, ur: NEGATIVE mg/dL
Specific Gravity, Urine: 1.015 (ref 1.005–1.030)
pH: 6 (ref 5.0–8.0)

## 2016-03-15 LAB — POCT FERN TEST: POCT Fern Test: NEGATIVE

## 2016-03-15 NOTE — MAU Provider Note (Signed)
History     CSN: [id number]  Arrival date and time: 03/15/16 1336   First Provider Initiated Contact with Patient 03/15/16 1449      Chief Complaint  Patient presents with  . Rupture of Membranes   HPI Comments: Anna Park is a 25 y.o. G2P0010 at [redacted]w[redacted]d presenting with possible ruptured membranes. She describes having fluid trickled down her legs right after taking a shower about noon today. Recurrence of fluid trickling down her leg again 10 minutes after the first episode. She has not had evident leakage since. Denies headache and epigastric pain, visual disturbance.   PNC : essentially uncomplicated until she had BP elevation last week with normal preE lab results.  Past Medical History  Diagnosis Date  . Headache     Past Surgical History  Procedure Laterality Date  . Dilation and curettage of uterus      Family History  Problem Relation Age of Onset  . Hypertension Mother     Social History  Substance Use Topics  . Smoking status: Never Smoker   . Smokeless tobacco: None  . Alcohol Use: Yes     Comment: DRINK  OCC- NONE  DURING  PREG    Allergies:  Allergies  Allergen Reactions  . Sulfa Antibiotics Hives    Prescriptions prior to admission  Medication Sig Dispense Refill Last Dose  . diphenhydramine-acetaminophen (TYLENOL PM) 25-500 MG TABS tablet Take 1 tablet by mouth at bedtime as needed (sleep).   03/14/2016 at Unknown time  . Prenatal Vit-Fe Fumarate-FA (PRENATAL MULTIVITAMIN) TABS tablet Take 1 tablet by mouth daily at 12 noon.   03/14/2016 at Unknown time  . acetaminophen (TYLENOL) 500 MG tablet Take 2 tablets (1,000 mg total) by mouth every 6 (six) hours as needed for moderate pain. (Patient not taking: Reported on 03/15/2016) 30 tablet 0     ROS Physical Exam   Filed Vitals:   03/15/16 1444 03/15/16 1454 03/15/16 1504 03/15/16 1512  BP: 129/87 126/89 130/94 134/89  Pulse: 88 91 133 88  Temp:      Resp:       Blood pressure 129/87, pulse 88,  temperature 97.8 F (36.6 C), resp. rate 16.  Physical Exam  Constitutional: She is oriented to person, place, and time. She appears well-developed and well-nourished. No distress.  HENT:  Head: Normocephalic.  Cardiovascular: Normal rate.   GI: Soft. There is no tenderness.  Genitourinary: Vagina normal.  SSE: neg pool. Fern neg.  Cx: posterior, ftp, 50/-1 cephalic  Musculoskeletal: Normal range of motion.  Neurological: She is alert and oriented to person, place, and time.  Skin: Skin is warm and dry.   EFM: 1:30 baseline, moderate variability, reactive. Occasional mild UC  MAU Course  Procedures Results for orders placed or performed during the hospital encounter of 03/15/16 (from the past 24 hour(s))  Urinalysis, Routine w reflex microscopic (not at [facility])     Status: None   Collection Time: 03/15/16  1:56 PM  Result Value Ref Range   Color, Urine YELLOW YELLOW   APPearance CLEAR CLEAR   Specific Gravity, Urine 1.015 1.005 - 1.030   pH 6.0 5.0 - 8.0   Glucose, UA NEGATIVE NEGATIVE mg/dL   Hgb urine dipstick NEGATIVE NEGATIVE   Bilirubin Urine NEGATIVE NEGATIVE   Ketones, ur NEGATIVE NEGATIVE mg/dL   Protein, ur NEGATIVE NEGATIVE mg/dL   Nitrite NEGATIVE NEGATIVE   Leukocytes, UA NEGATIVE NEGATIVE  Protein / creatinine ratio, urine     Status: None  Collection Time: 03/15/16  1:56 PM  Result Value Ref Range   Creatinine, Urine 114.00 mg/dL   Total Protein, Urine 13 mg/dL   Protein Creatinine Ratio 0.11 0.00 - 0.15 mg/mg[Cre]  Fern Test     Status: None   Collection Time: 03/15/16  3:00 PM  Result Value Ref Range   POCT Fern Test Negative = intact amniotic membranes   Comprehensive metabolic panel     Status: Abnormal   Collection Time: 03/15/16  3:06 PM  Result Value Ref Range   Sodium 138 135 - 145 mmol/L   Potassium 3.6 3.5 - 5.1 mmol/L   Chloride 108 101 - 111 mmol/L   CO2 21 (L) 22 - 32 mmol/L   Glucose, Bld 82 65 - 99 mg/dL   BUN 6 6 - 20 mg/dL    Creatinine, Ser 1.610.37 (L) 0.44 - 1.00 mg/dL   Calcium 8.7 (L) 8.9 - 10.3 mg/dL   Total Protein 6.7 6.5 - 8.1 g/dL   Albumin 3.1 (L) 3.5 - 5.0 g/dL   AST 17 15 - 41 U/L   ALT 11 (L) 14 - 54 U/L   Alkaline Phosphatase 91 38 - 126 U/L   Total Bilirubin 0.1 (L) 0.3 - 1.2 mg/dL   GFR calc non Af Amer >60 >60 mL/min   GFR calc Af Amer >60 >60 mL/min   Anion gap 9 5 - 15  CBC     Status: Abnormal   Collection Time: 03/15/16  3:06 PM  Result Value Ref Range   WBC 10.4 4.0 - 10.5 K/uL   RBC 3.73 (L) 3.87 - 5.11 MIL/uL   Hemoglobin 10.8 (L) 12.0 - 15.0 g/dL   HCT 09.633.0 (L) 04.536.0 - 40.946.0 %   MCV 88.5 78.0 - 100.0 fL   MCH 29.0 26.0 - 34.0 pg   MCHC 32.7 30.0 - 36.0 g/dL   RDW 81.114.9 91.411.5 - 78.215.5 %   Platelets 202 150 - 400 K/uL   MDM Consult with Dr. Rayne DuGewal  Assessment and Plan  G1 at 5972w6d Borderline gestational HTN at term with unfavorable cx [facility]  Discharge home with follow-up in office tomorrow   Medication List    STOP taking these medications        acetaminophen 500 MG tablet  Commonly known as:  TYLENOL      TAKE these medications        diphenhydramine-acetaminophen 25-500 MG Tabs tablet  Commonly known as:  TYLENOL PM  Take 1 tablet by mouth at bedtime as needed (sleep).     prenatal multivitamin Tabs tablet  Take 1 tablet by mouth daily at 12 noon.       Follow-up Information    Follow up with GREWAL,MICHELLE L, MD. Schedule an appointment as soon as possible for a visit in 1 day.   Specialty:  Obstetrics and Gynecology   Contact information:   [address] [place] [id number] [phone]      [name] 03/15/2016, 2:50 PM

## 2016-03-15 NOTE — MAU Note (Signed)
Pt presents to MAU with complaints of a "trickle" of fluid running down her leg after getting out of the shower this morning. Pt reports intercourse this morning. PIH labs this past week in the office for elevated BP.

## 2016-03-15 NOTE — Discharge Instructions (Signed)

## 2016-03-23 ENCOUNTER — Inpatient Hospital Stay (HOSPITAL_COMMUNITY)
Admission: AD | Admit: 2016-03-23 | Discharge: 2016-03-26 | DRG: 775 | Disposition: A | Discharge status: Home / Self Care | Payer: BC Managed Care – PPO | Source: Ambulatory Visit | Attending: Obstetrics and Gynecology | Admitting: Obstetrics and Gynecology

## 2016-03-23 ENCOUNTER — Encounter (HOSPITAL_COMMUNITY): Payer: Self-pay | Admitting: Obstetrics

## 2016-03-23 ENCOUNTER — Inpatient Hospital Stay (HOSPITAL_COMMUNITY): Payer: BC Managed Care – PPO | Admitting: Anesthesiology

## 2016-03-23 DIAGNOSIS — R8271 Bacteriuria: Secondary | ICD-10-CM

## 2016-03-23 DIAGNOSIS — Z3A40 40 weeks gestation of pregnancy: Secondary | ICD-10-CM

## 2016-03-23 DIAGNOSIS — Z8249 Family history of ischemic heart disease and other diseases of the circulatory system: Secondary | ICD-10-CM

## 2016-03-23 DIAGNOSIS — O99824 Streptococcus B carrier state complicating childbirth: Secondary | ICD-10-CM | POA: Diagnosis present

## 2016-03-23 DIAGNOSIS — Z3490 Encounter for supervision of normal pregnancy, unspecified, unspecified trimester: Secondary | ICD-10-CM

## 2016-03-23 LAB — CBC
HCT: 31.7 % — ABNORMAL LOW (ref 36.0–46.0)
Hemoglobin: 10.3 g/dL — ABNORMAL LOW (ref 12.0–15.0)
MCH: 28.7 pg (ref 26.0–34.0)
MCHC: 32.5 g/dL (ref 30.0–36.0)
MCV: 88.3 fL (ref 78.0–100.0)
Platelets: 184 10*3/uL (ref 150–400)
RBC: 3.59 MIL/uL — ABNORMAL LOW (ref 3.87–5.11)
RDW: 15.2 % (ref 11.5–15.5)
WBC: 9.2 10*3/uL (ref 4.0–10.5)

## 2016-03-23 LAB — COMPREHENSIVE METABOLIC PANEL
ALT: 10 U/L — ABNORMAL LOW (ref 14–54)
AST: 17 U/L (ref 15–41)
Albumin: 3 g/dL — ABNORMAL LOW (ref 3.5–5.0)
Alkaline Phosphatase: 92 U/L (ref 38–126)
Anion gap: 9 (ref 5–15)
BUN: 7 mg/dL (ref 6–20)
CO2: 23 mmol/L (ref 22–32)
Calcium: 8.7 mg/dL — ABNORMAL LOW (ref 8.9–10.3)
Chloride: 107 mmol/L (ref 101–111)
Creatinine, Ser: 0.44 mg/dL (ref 0.44–1.00)
GFR calc Af Amer: >60 mL/min (ref >60)
GFR calc non Af Amer: >60 mL/min (ref >60)
Glucose, Bld: 98 mg/dL (ref 65–99)
Potassium: 3.6 mmol/L (ref 3.5–5.1)
Sodium: 139 mmol/L (ref 135–145)
Total Bilirubin: 0.4 mg/dL (ref 0.3–1.2)
Total Protein: 6.5 g/dL (ref 6.5–8.1)

## 2016-03-23 LAB — TYPE AND SCREEN
ABO/RH(D): O POS
Antibody Screen: NEGATIVE

## 2016-03-23 LAB — ABO/RH: ABO/RH(D): O POS

## 2016-03-23 MED ORDER — OXYTOCIN 40 UNITS IN LACTATED RINGERS INFUSION - SIMPLE MED
2.5000 [IU]/h | INTRAVENOUS | Status: DC
  Administered 2016-03-24: 2.5 [IU]/h via INTRAVENOUS
  Filled 2016-03-23: qty 1000

## 2016-03-23 MED ORDER — LACTATED RINGERS IV SOLN
INTRAVENOUS | Status: DC
  Administered 2016-03-23 – 2016-03-24 (×3): via INTRAVENOUS

## 2016-03-23 MED ORDER — TERBUTALINE SULFATE 1 MG/ML IJ SOLN
0.2500 mg | Freq: Once | INTRAMUSCULAR | Status: DC | PRN
  Filled 2016-03-23: qty 1

## 2016-03-23 MED ORDER — PENICILLIN G POTASSIUM 5000000 UNITS IJ SOLR
5.0000 10*6.[IU] | Freq: Once | INTRAVENOUS | Status: AC
  Administered 2016-03-23: 5 10*6.[IU] via INTRAVENOUS
  Filled 2016-03-23: qty 5

## 2016-03-23 MED ORDER — EPHEDRINE 5 MG/ML INJ
10.0000 mg | INTRAVENOUS | Status: DC | PRN
  Filled 2016-03-23: qty 2

## 2016-03-23 MED ORDER — LACTATED RINGERS IV SOLN
500.0000 mL | Freq: Once | INTRAVENOUS | Status: AC
  Administered 2016-03-23: 500 mL via INTRAVENOUS

## 2016-03-23 MED ORDER — EPHEDRINE 5 MG/ML INJ
10.0000 mg | INTRAVENOUS | Status: DC | PRN
Start: 2016-03-23 — End: 2016-03-24
  Filled 2016-03-23: qty 2

## 2016-03-23 MED ORDER — OXYCODONE-ACETAMINOPHEN 5-325 MG PO TABS: 2.0000 | ORAL_TABLET | ORAL | Status: DC | PRN

## 2016-03-23 MED ORDER — OXYCODONE-ACETAMINOPHEN 5-325 MG PO TABS: 1.0000 | ORAL_TABLET | ORAL | Status: DC | PRN

## 2016-03-23 MED ORDER — ONDANSETRON HCL 4 MG/2ML IJ SOLN: 4.0000 mg | Freq: Four times a day (QID) | INTRAMUSCULAR | Status: DC | PRN

## 2016-03-23 MED ORDER — LIDOCAINE HCL (PF) 1 % IJ SOLN
30.0000 mL | INTRAMUSCULAR | Status: DC | PRN
  Filled 2016-03-23: qty 30

## 2016-03-23 MED ORDER — ACETAMINOPHEN 325 MG PO TABS: 650.0000 mg | ORAL_TABLET | ORAL | Status: DC | PRN

## 2016-03-23 MED ORDER — LACTATED RINGERS IV SOLN
500.0000 mL | INTRAVENOUS | Status: DC | PRN
  Administered 2016-03-24: 500 mL via INTRAVENOUS

## 2016-03-23 MED ORDER — OXYTOCIN 40 UNITS IN LACTATED RINGERS INFUSION - SIMPLE MED
1.0000 m[IU]/min | INTRAVENOUS | Status: DC
  Administered 2016-03-23: 2 m[IU]/min via INTRAVENOUS

## 2016-03-23 MED ORDER — OXYTOCIN BOLUS FROM INFUSION: 500.0000 mL | INTRAVENOUS | Status: DC

## 2016-03-23 MED ORDER — BUTORPHANOL TARTRATE 1 MG/ML IJ SOLN: 1.0000 mg | INTRAMUSCULAR | Status: DC | PRN

## 2016-03-23 MED ORDER — CITRIC ACID-SODIUM CITRATE 334-500 MG/5ML PO SOLN: 30.0000 mL | ORAL | Status: DC | PRN

## 2016-03-23 MED ORDER — PENICILLIN G POTASSIUM 5000000 UNITS IJ SOLR
2.5000 10*6.[IU] | INTRAVENOUS | Status: DC
  Administered 2016-03-23 – 2016-03-24 (×3): 2.5 10*6.[IU] via INTRAVENOUS
  Filled 2016-03-23 (×6): qty 2.5

## 2016-03-23 MED ORDER — FENTANYL 2.5 MCG/ML BUPIVACAINE 1/10 % EPIDURAL INFUSION (WH - ANES)
14.0000 mL/h | INTRAMUSCULAR | Status: DC | PRN
  Administered 2016-03-23 – 2016-03-24 (×3): 14 mL/h via EPIDURAL
  Filled 2016-03-23 (×3): qty 125

## 2016-03-23 MED ORDER — PHENYLEPHRINE 40 MCG/ML (10ML) SYRINGE FOR IV PUSH (FOR BLOOD PRESSURE SUPPORT)
80.0000 ug | PREFILLED_SYRINGE | INTRAVENOUS | Status: DC | PRN
  Filled 2016-03-23: qty 5

## 2016-03-23 MED ORDER — FLEET ENEMA 7-19 GM/118ML RE ENEM: 1.0000 | ENEMA | RECTAL | Status: DC | PRN

## 2016-03-23 MED ORDER — LIDOCAINE HCL (PF) 1 % IJ SOLN
INTRAMUSCULAR | Status: DC | PRN
  Administered 2016-03-23 (×2): 5 mL via EPIDURAL

## 2016-03-23 MED ORDER — PHENYLEPHRINE 40 MCG/ML (10ML) SYRINGE FOR IV PUSH (FOR BLOOD PRESSURE SUPPORT)
80.0000 ug | PREFILLED_SYRINGE | INTRAVENOUS | Status: DC | PRN
  Filled 2016-03-23: qty 10
  Filled 2016-03-23: qty 5

## 2016-03-23 MED ORDER — DIPHENHYDRAMINE HCL 50 MG/ML IJ SOLN: 12.5000 mg | INTRAMUSCULAR | Status: DC | PRN

## 2016-03-23 NOTE — Anesthesia Pain Management Evaluation Note (Signed)
  CRNA Pain Management Visit Note  Patient: Anna ShellingCatherine Byrd, 25 y.o., female  "Hello I am a member of the anesthesia team at [facility]. We have an anesthesia team available at all times to provide care throughout the hospital, including epidural management and anesthesia for C-section. I don't know your plan for the delivery whether it a natural birth, water birth, IV sedation, nitrous supplementation, doula or epidural, but we want to meet your pain goals."   1.Was your pain managed to your expectations on prior hospitalizations?   No prior hospitalization  2.What is your expectation for pain management during this hospitalization?     Epidural  3.How can we help you reach that goal? IV meds and Epidural  Record the patient's initial score and the patient's pain goal.   Pain: 0  Pain Goal: 7 [facility] wants you to be able to say your pain was always managed very well.  [facility],Jeet Shough 03/23/2016

## 2016-03-23 NOTE — H&P (Signed)
Anna ShellingCatherine Park is a 25 y.o. female presenting for labor.  Patient was seen in the office this am for routine visit and was c/o CTX and SVE 4/90/-2.  Antepartum course uncomplicated.  GBS positive.   Maternal Medical History:  Reason for admission: Contractions.   Contractions: Onset was 6-12 hours ago.   Frequency: regular.   Perceived severity is moderate.    Fetal activity: Perceived fetal activity is normal.   Last perceived fetal movement was within the past hour.    Prenatal complications: no prenatal complications Prenatal Complications - Diabetes: none.    OB History    Gravida Para Term Preterm AB TAB SAB Ectopic Multiple Living   2    1  1         Past Medical History  Diagnosis Date  . Headache    Past Surgical History  Procedure Laterality Date  . Dilation and curettage of uterus     Family History: family history includes Hypertension in her mother. Social History:  reports that she has never smoked. She does not have any smokeless tobacco history on file. She reports that she drinks alcohol. She reports that she does not use illicit drugs.   Prenatal Transfer Tool  Maternal Diabetes: No Genetic Screening: Normal Maternal Ultrasounds/Referrals: Normal Fetal Ultrasounds or other Referrals:  None Maternal Substance Abuse:  No Significant Maternal Medications:  None Significant Maternal Lab Results:  Lab values include: Group B Strep positive Other Comments:  None  ROS    Blood pressure 146/96, pulse 85, height 5\' 6"  (1.676 m), weight 211 lb 9.6 oz (95.981 kg). Maternal Exam:  Uterine Assessment: Contraction strength is moderate.  Contraction frequency is regular.   Abdomen: Patient reports no abdominal tenderness. Fundal height is c/w dates.   Estimated fetal weight is 8#.       Physical Exam  Constitutional: She is oriented to person, place, and time. She appears well-developed and well-nourished.  GI: Soft. There is no rebound and no guarding.   Neurological: She is alert and oriented to person, place, and time.  Skin: Skin is warm and dry.  Psychiatric: She has a normal mood and affect. Her behavior is normal.    Prenatal labs: ABO, Rh:   Antibody:   Rubella:   RPR:    HBsAg:    HIV:    GBS:     Assessment/Plan: 24yo G2P0010 at 9067w0d with labor -Epidural when ready -Anticipate NSVD   Anna Park 03/23/2016, 1:59 PM

## 2016-03-23 NOTE — Progress Notes (Signed)
SVE 6/75/-2, IUPC placed Will dose pit to adequate MVUs.  Mitchel HonourMegan Daylan Boggess, DO

## 2016-03-23 NOTE — Anesthesia Preprocedure Evaluation (Signed)
Anesthesia Evaluation  Patient identified by MRN, date of birth, ID band Patient awake    Reviewed: Allergy & Precautions, H&P , NPO status , Patient's Chart, lab work & pertinent test results  History of Anesthesia Complications Negative for: history of anesthetic complications  Airway Mallampati: II  TM Distance: >3 FB Neck ROM: full    Dental  (+) Teeth Intact, Dental Advidsory Given   Pulmonary neg pulmonary ROS,    breath sounds clear to auscultation       Cardiovascular negative cardio ROS   Rhythm:regular Rate:Normal     Neuro/Psych negative neurological ROS  negative psych ROS   GI/Hepatic negative GI ROS, Neg liver ROS,   Endo/Other  negative endocrine ROS  Renal/GU negative Renal ROS     Musculoskeletal   Abdominal   Peds  Hematology   Anesthesia Other Findings   Reproductive/Obstetrics (+) Pregnancy                             Anesthesia Physical Anesthesia Plan  ASA: II  Anesthesia Plan: Epidural   Post-op Pain Management:    Induction:   Airway Management Planned:   Additional Equipment:   Intra-op Plan:   Post-operative Plan:   Informed Consent: I have reviewed the patients History and Physical, chart, labs and discussed the procedure including the risks, benefits and alternatives for the proposed anesthesia with the patient or authorized representative who has indicated his/her understanding and acceptance.   Dental Advisory Given  Plan Discussed with: Anesthesiologist  Anesthesia Plan Comments:         Anesthesia Quick Evaluation

## 2016-03-23 NOTE — Anesthesia Procedure Notes (Signed)
Epidural Patient location during procedure: OB Start time: 03/23/2016 5:47 PM End time: 03/23/2016 5:52 PM  Staffing Anesthesiologist: Heather RobertsSINGER, Phylicia Mcgaugh Performed by: anesthesiologist   Preanesthetic Checklist Completed: patient identified, site marked, pre-op evaluation, timeout performed, IV checked, risks and benefits discussed and monitors and equipment checked  Epidural Patient position: sitting Prep: DuraPrep Patient monitoring: heart rate, cardiac monitor, continuous pulse ox and blood pressure Approach: midline Location: L2-L3 Injection technique: LOR saline  Needle:  Needle type: Tuohy  Needle gauge: 17 G Needle length: 9 cm Needle insertion depth: 7 cm Catheter size: 20 Guage Catheter at skin depth: 11 cm Test dose: negative and Other  Assessment Events: blood not aspirated, injection not painful, no injection resistance and negative IV test  Additional Notes Informed consent obtained prior to proceeding including risk of failure, 1% risk of PDPH, risk of minor discomfort and bruising.  Discussed rare but serious complications including epidural abscess, permanent nerve injury, epidural hematoma.  Discussed alternatives to epidural analgesia and patient desires to proceed.  Timeout performed pre-procedure verifying patient name, procedure, and platelet count.  Patient tolerated procedure well.

## 2016-03-23 NOTE — Progress Notes (Signed)
CVX 3/75/-1, vtx AROM, clear Continue pitocin Epidural when ready.  Mitchel HonourMegan Jaymz Traywick, DO

## 2016-03-24 ENCOUNTER — Encounter (HOSPITAL_COMMUNITY): Payer: Self-pay

## 2016-03-24 LAB — CBC
HCT: 29.5 % — ABNORMAL LOW (ref 36.0–46.0)
Hemoglobin: 9.5 g/dL — ABNORMAL LOW (ref 12.0–15.0)
MCH: 28.6 pg (ref 26.0–34.0)
MCHC: 32.2 g/dL (ref 30.0–36.0)
MCV: 88.9 fL (ref 78.0–100.0)
Platelets: 185 10*3/uL (ref 150–400)
RBC: 3.32 MIL/uL — ABNORMAL LOW (ref 3.87–5.11)
RDW: 15.6 % — ABNORMAL HIGH (ref 11.5–15.5)
WBC: 19.2 10*3/uL — ABNORMAL HIGH (ref 4.0–10.5)

## 2016-03-24 LAB — RPR: RPR Ser Ql: NONREACTIVE

## 2016-03-24 MED ORDER — ONDANSETRON HCL 4 MG PO TABS: 4.0000 mg | ORAL_TABLET | ORAL | Status: DC | PRN

## 2016-03-24 MED ORDER — BENZOCAINE-MENTHOL 20-0.5 % EX AERO
1.0000 "application " | INHALATION_SPRAY | CUTANEOUS | Status: DC | PRN
  Administered 2016-03-24 – 2016-03-25 (×2): 1 via TOPICAL
  Filled 2016-03-24 (×2): qty 56

## 2016-03-24 MED ORDER — WITCH HAZEL-GLYCERIN EX PADS
1.0000 "application " | MEDICATED_PAD | CUTANEOUS | Status: DC | PRN
  Administered 2016-03-25: 1 via TOPICAL

## 2016-03-24 MED ORDER — GENTAMICIN SULFATE 40 MG/ML IJ SOLN
2.0000 mg/kg | Freq: Three times a day (TID) | INTRAVENOUS | Status: DC
  Administered 2016-03-24: 190 mg via INTRAVENOUS
  Filled 2016-03-24 (×2): qty 4.75

## 2016-03-24 MED ORDER — OXYCODONE HCL 5 MG PO TABS
10.0000 mg | ORAL_TABLET | ORAL | Status: DC | PRN
  Administered 2016-03-26: 10 mg via ORAL
  Filled 2016-03-24: qty 2

## 2016-03-24 MED ORDER — DIBUCAINE 1 % RE OINT
1.0000 "application " | TOPICAL_OINTMENT | RECTAL | Status: DC | PRN
  Administered 2016-03-25: 1 via RECTAL
  Filled 2016-03-24: qty 28

## 2016-03-24 MED ORDER — DIPHENHYDRAMINE HCL 25 MG PO CAPS: 25.0000 mg | ORAL_CAPSULE | Freq: Four times a day (QID) | ORAL | Status: DC | PRN

## 2016-03-24 MED ORDER — MEASLES, MUMPS & RUBELLA VAC ~~LOC~~ INJ
0.5000 mL | INJECTION | Freq: Once | SUBCUTANEOUS | Status: AC
  Administered 2016-03-26: 0.5 mL via SUBCUTANEOUS
  Filled 2016-03-24 (×2): qty 0.5

## 2016-03-24 MED ORDER — SIMETHICONE 80 MG PO CHEW: 80.0000 mg | CHEWABLE_TABLET | ORAL | Status: DC | PRN

## 2016-03-24 MED ORDER — COCONUT OIL OIL: 1.0000 "application " | TOPICAL_OIL | Status: DC | PRN

## 2016-03-24 MED ORDER — SENNOSIDES-DOCUSATE SODIUM 8.6-50 MG PO TABS
2.0000 | ORAL_TABLET | ORAL | Status: DC
  Administered 2016-03-25 – 2016-03-26 (×2): 2 via ORAL
  Filled 2016-03-24 (×2): qty 2

## 2016-03-24 MED ORDER — BISACODYL 10 MG RE SUPP: 10.0000 mg | Freq: Every day | RECTAL | Status: DC | PRN

## 2016-03-24 MED ORDER — MEDROXYPROGESTERONE ACETATE 150 MG/ML IM SUSP: 150.0000 mg | INTRAMUSCULAR | Status: DC | PRN

## 2016-03-24 MED ORDER — TETANUS-DIPHTH-ACELL PERTUSSIS 5-2.5-18.5 LF-MCG/0.5 IM SUSP: 0.5000 mL | Freq: Once | INTRAMUSCULAR | Status: DC

## 2016-03-24 MED ORDER — ZOLPIDEM TARTRATE 5 MG PO TABS: 5.0000 mg | ORAL_TABLET | Freq: Every evening | ORAL | Status: DC | PRN

## 2016-03-24 MED ORDER — ACETAMINOPHEN 325 MG PO TABS
650.0000 mg | ORAL_TABLET | ORAL | Status: DC | PRN
Start: 2016-03-24 — End: 2016-03-26
  Administered 2016-03-26: 650 mg via ORAL
  Filled 2016-03-24: qty 2

## 2016-03-24 MED ORDER — ONDANSETRON HCL 4 MG/2ML IJ SOLN: 4.0000 mg | INTRAMUSCULAR | Status: DC | PRN

## 2016-03-24 MED ORDER — OXYCODONE HCL 5 MG PO TABS
5.0000 mg | ORAL_TABLET | ORAL | Status: DC | PRN
  Administered 2016-03-24 – 2016-03-25 (×4): 5 mg via ORAL
  Filled 2016-03-24 (×4): qty 1

## 2016-03-24 MED ORDER — PRENATAL MULTIVITAMIN CH
1.0000 | ORAL_TABLET | Freq: Every day | ORAL | Status: DC
  Administered 2016-03-24 – 2016-03-25 (×2): 1 via ORAL
  Filled 2016-03-24 (×2): qty 1

## 2016-03-24 MED ORDER — FLEET ENEMA 7-19 GM/118ML RE ENEM: 1.0000 | ENEMA | Freq: Every day | RECTAL | Status: DC | PRN

## 2016-03-24 MED ORDER — ACETAMINOPHEN 500 MG PO TABS
1000.0000 mg | ORAL_TABLET | Freq: Four times a day (QID) | ORAL | Status: DC | PRN
  Administered 2016-03-24 (×2): 1000 mg via ORAL
  Filled 2016-03-24 (×2): qty 2

## 2016-03-24 MED ORDER — SODIUM CHLORIDE 0.9 % IV SOLN
2.0000 g | Freq: Four times a day (QID) | INTRAVENOUS | Status: DC
  Administered 2016-03-24: 2 g via INTRAVENOUS
  Filled 2016-03-24 (×2): qty 2000

## 2016-03-24 MED ORDER — IBUPROFEN 600 MG PO TABS
600.0000 mg | ORAL_TABLET | Freq: Four times a day (QID) | ORAL | Status: DC
  Administered 2016-03-24 – 2016-03-26 (×8): 600 mg via ORAL
  Filled 2016-03-24 (×8): qty 1

## 2016-03-24 NOTE — Progress Notes (Signed)
[name] was referred for history of depression/anxiety. * Referral screened out by Clinical Social Worker because none of the following criteria appear to apply: ~ History of anxiety/depression during this pregnancy, or of post-partum depression. ~ Diagnosis of anxiety and/or depression within last 3 years OR * [name]'s symptoms currently being treated with medication and/or therapy. Please contact the Clinical Social Worker if needs arise, or if [name] requests.  PNR states MGM has Anxiety and specifies that [name] has "situational anxiety."  Please re-consult CSW if acute concerns are noted.   

## 2016-03-24 NOTE — Progress Notes (Signed)
Iv still in at 2150 assessment. Removed at 2156. JTWells, rn

## 2016-03-24 NOTE — Anesthesia Postprocedure Evaluation (Signed)
Anesthesia Post Note  Patient: Anna Park  Procedure(s) Performed: * No procedures listed *  Patient location during evaluation: Mother Baby Anesthesia Type: Epidural Level of consciousness: awake Pain management: satisfactory to patient Vital Signs Assessment: post-procedure vital signs reviewed and stable Respiratory status: spontaneous breathing Cardiovascular status: stable Anesthetic complications: no Comments: Current pain 4     Last Vitals:  Filed Vitals:   03/24/16 1115 03/24/16 1225  BP: 115/73 121/71  Pulse: 103 111  Temp: 37.3 C   Resp: 18 20    Last Pain:  Filed Vitals:   03/24/16 1451  PainSc: 0-No pain   Pain Goal:                 [name],[name]

## 2016-03-24 NOTE — Progress Notes (Signed)
Patient's left eye appears droopy. Patient states she has noticed more twitch to her eye tonight. RROB RN asked to come assess as well. She is PERRLA, grip strength equal just weak, noticed less sensation to her left cheek, compared to right cheek. Smile is symmetrical. Dr. Langston MaskerMorris made aware.

## 2016-03-24 NOTE — Progress Notes (Signed)
Patient is doing well. No complaint except "tired".  FHR is Category 1  Temp 98.8  Cervix C/C/+2  Patient's eyelid does not appear to be droopy  Will start pushing  Continuous monitoring

## 2016-03-24 NOTE — Lactation Note (Signed)
This note was copied from a baby's chart. Lactation Consultation Note Initial visit at 8 hours of age.  Mom reports a recent good feeding and denies pain with latch.  Baby is getting hearing screening and mom is eating.  LC offered to assist with hand expression, but mom declined at this time.  Encouraged mom to demonstrate for RN later this evening.  [facility] given and discussed.  Encouraged to feed with early cues on demand.  Early newborn behavior discussed.   Mom to call for assist as needed.    Patient Name: Girl Irven ShellingCatherine Musolino ZOXWR'UToday's Date: 03/24/2016 Reason for consult: Initial assessment   Maternal Data Has patient been taught Hand Expression?: Yes Does the patient have breastfeeding experience prior to this delivery?: No  Feeding Feeding Type: Breast Fed Length of feed: 15 min  LATCH Score/Interventions Latch: Repeated attempts needed to sustain latch, nipple held in mouth throughout feeding, stimulation needed to elicit sucking reflex. Intervention(s): Adjust position;Assist with latch;Breast massage;Breast compression  Audible Swallowing: None Intervention(s): Skin to skin;Hand expression  Type of Nipple: Everted at rest and after stimulation  Comfort (Breast/Nipple): Soft / non-tender     Hold (Positioning): Assistance needed to correctly position infant at breast and maintain latch. Intervention(s): Breastfeeding basics reviewed  LATCH Score: 6  Lactation Tools Discussed/Used     Consult Status Consult Status: Follow-up Date: 03/25/16 Follow-up type: In-patient    Shoptaw, Arvella MerlesJana Lynn 03/24/2016, 5:20 PM

## 2016-03-25 LAB — CBC
HCT: 24.6 % — ABNORMAL LOW (ref 36.0–46.0)
Hemoglobin: 8.1 g/dL — ABNORMAL LOW (ref 12.0–15.0)
MCH: 29.1 pg (ref 26.0–34.0)
MCHC: 32.9 g/dL (ref 30.0–36.0)
MCV: 88.5 fL (ref 78.0–100.0)
Platelets: 156 10*3/uL (ref 150–400)
RBC: 2.78 MIL/uL — ABNORMAL LOW (ref 3.87–5.11)
RDW: 15.6 % — ABNORMAL HIGH (ref 11.5–15.5)
WBC: 15.2 10*3/uL — ABNORMAL HIGH (ref 4.0–10.5)

## 2016-03-25 NOTE — Lactation Note (Addendum)
This note was copied from a baby's chart. Lactation Consultation Note  Patient Name: Anna Irven ShellingCatherine Schadler ZOXWR'UToday's Date: 03/25/2016 Reason for consult: Follow-up assessment  Baby is 30 hours old , and has been to the breast several times in the last 30 hours , voids and stools adequate for age .  Mom has been hand expressing and before latch , LC showed mom how to finger feed with a curved tip syringe ( 3 ml of colostrum )  Baby tolerated well. Baby latched well in laid back position , with multiply swallows, increased with breast compressions.  Per mom comfortable. Latch score 8 .      Maternal Data Has patient been taught Hand Expression?: Yes  Feeding Feeding Type: Breast Fed Length of feed:  (still feeding at 12 mins . laid back position with swallows , increased w / Breast compressions )  LATCH Score/Interventions Latch: Repeated attempts needed to sustain latch, nipple held in mouth throughout feeding, stimulation needed to elicit sucking reflex. Intervention(s): Adjust position;Assist with latch;Breast massage;Breast compression  Audible Swallowing: Spontaneous and intermittent  Type of Nipple: Everted at rest and after stimulation  Comfort (Breast/Nipple): Soft / non-tender     Hold (Positioning): Assistance needed to correctly position infant at breast and maintain latch. Intervention(s): Breastfeeding basics reviewed;Support Pillows;Position options;Skin to skin  LATCH Score: 8  Lactation Tools Discussed/Used Tools: Pump (#24 [place] comfortable ) Breast pump type: Manual   Consult Status Consult Status: Follow-up Date: 03/26/16 Follow-up type: In-patient    [name], [name] 03/25/2016, 3:59 PM

## 2016-03-25 NOTE — Progress Notes (Signed)
Post Partum Day 1 Subjective: no complaints  Objective: Blood pressure 125/83, pulse 84, temperature 97.7 F (36.5 C), temperature source Oral, resp. rate 18, height 5\' 6"  (1.676 m), weight 211 lb 9.6 oz (95.981 kg), SpO2 97 %, unknown if currently breastfeeding.  Physical Exam:  General: alert Lochia: appropriate Uterine Fundus: firm Incision: healing well DVT Evaluation: No evidence of DVT seen on physical exam.   Recent Labs  03/24/16 1109 03/25/16 0558  HGB 9.5* 8.1*  HCT 29.5* 24.6*    Assessment/Plan: Plan for discharge tomorrow   LOS: 2 days   Mirai Greenwood M 03/25/2016, 11:27 AM

## 2016-03-26 LAB — CBC
HCT: 23.3 % — ABNORMAL LOW (ref 36.0–46.0)
Hemoglobin: 7.7 g/dL — ABNORMAL LOW (ref 12.0–15.0)
MCH: 29.2 pg (ref 26.0–34.0)
MCHC: 33 g/dL (ref 30.0–36.0)
MCV: 88.3 fL (ref 78.0–100.0)
Platelets: 191 10*3/uL (ref 150–400)
RBC: 2.64 MIL/uL — ABNORMAL LOW (ref 3.87–5.11)
RDW: 15.6 % — ABNORMAL HIGH (ref 11.5–15.5)
WBC: 11.7 10*3/uL — ABNORMAL HIGH (ref 4.0–10.5)

## 2016-03-26 MED ORDER — OXYCODONE-ACETAMINOPHEN 5-325 MG PO TABS: 1.0000 | ORAL_TABLET | ORAL | Status: DC | PRN

## 2016-03-26 MED ORDER — IBUPROFEN 600 MG PO TABS
600.0000 mg | ORAL_TABLET | Freq: Four times a day (QID) | ORAL | Status: DC
Start: 2016-03-26 — End: 2020-06-02

## 2016-03-26 NOTE — Discharge Summary (Signed)
Obstetric Discharge Summary Reason for Admission: onset of labor Prenatal Procedures: none Intrapartum Procedures: spontaneous vaginal delivery Postpartum Procedures: none Complications-Operative and Postpartum: 2 degree perineal laceration HEMOGLOBIN  Date Value Ref Range Status  03/26/2016 7.7* 12.0 - 15.0 g/dL Final   HCT  Date Value Ref Range Status  03/26/2016 23.3* 36.0 - 46.0 % Final    Physical Exam:  General: alert, cooperative, appears stated age and mild distress Lochia: appropriate Uterine Fundus: firm Incision: healing well DVT Evaluation: No evidence of DVT seen on physical exam.  Discharge Diagnoses: Term Pregnancy-delivered  Discharge Information: Date: 03/26/2016 Activity: pelvic rest Diet: routine Medications: PNV, Ibuprofen and Percocet Condition: stable Instructions: refer to practice specific booklet Discharge to: home   Newborn Data: Live born female  Birth Weight: 8 lb 6.4 oz (3810 g) APGAR: 9, 9  Home with mother.  Jiovanna Frei C 03/26/2016, 9:32 AM

## 2016-03-26 NOTE — Lactation Note (Signed)
This note was copied from a baby's chart. Lactation Consultation Note  Patient Name: Anna Park Reason for consult: Follow-up assessment  Baby is 3149 hours old and at 7% weight loss, 44 hours old - Bili 10.1. Baby has been consistent at the breast and at consult had a transitional stool.  Per mom and dad baby last fed at at 0402 for 38 mins. And has been sleepy ever since.  @ this consult tried 2 different positions and the feeding pattern was on and off with swallows.  Baby acting like it isn't her time a day to feed, also got the hiccups, noted to be gasey.  MBU RN aware of attempts to latch and parents are aware they can call for assistance . LC recommended  Skin to skin , and wait for stronger feeding cues.  [facility] pump [facility] and per mom plans to call insurance company for her DEBP . LC recommended  If the Bili is higher tomorrow, and weight stable , to check with insurance company to see if there is a medical supply  [place] the DEBP. Also mentioned to mom LC office does the 2 week rental.  Mother informed of post-discharge support and given phone number to the lactation department, including services for phone call assistance; out-patient appointments; and breastfeeding support group. List of other breastfeeding resources in the community given in the handout. Encouraged mother to call for problems or concerns related to breastfeeding.   Maternal Data Has patient been taught Hand Expression?: Yes  Feeding Feeding Type: Breast Fed (changed position ) Length of feed:  (few sucks and swallows )  LATCH Score/Interventions Latch: Repeated attempts needed to sustain latch, nipple held in mouth throughout feeding, stimulation needed to elicit sucking reflex. Intervention(s): Adjust position;Assist with latch;Breast massage;Breast compression  Audible Swallowing: A few with stimulation  Type of Nipple: Everted at rest and  after stimulation  Comfort (Breast/Nipple): Soft / non-tender     Hold (Positioning): Assistance needed to correctly position infant at breast and maintain latch. Intervention(s): Breastfeeding basics reviewed;Support Pillows;Position options;Skin to skin  LATCH Score: 7  Lactation Tools Discussed/Used Tools: Pump Breast pump type: Manual WIC Program: No   Consult Status Consult Status: Complete Date: 03/26/16 Follow-up type: In-patient    [name], Angeleen Horney Ann Park, 10:52 AM

## 2016-03-29 ENCOUNTER — Telehealth (HOSPITAL_COMMUNITY): Payer: Self-pay

## 2016-04-02 ENCOUNTER — Inpatient Hospital Stay (HOSPITAL_COMMUNITY)
Admission: AD | Admit: 2016-04-02 | Discharge: 2016-04-05 | DRG: 776 | Disposition: A | Discharge status: Home / Self Care | Payer: BC Managed Care – PPO | Source: Ambulatory Visit | Attending: Obstetrics and Gynecology | Admitting: Obstetrics and Gynecology

## 2016-04-02 ENCOUNTER — Inpatient Hospital Stay (HOSPITAL_COMMUNITY): Payer: BC Managed Care – PPO

## 2016-04-02 ENCOUNTER — Encounter (HOSPITAL_COMMUNITY): Payer: Self-pay | Admitting: *Deleted

## 2016-04-02 DIAGNOSIS — Z882 Allergy status to sulfonamides status: Secondary | ICD-10-CM | POA: Diagnosis not present

## 2016-04-02 DIAGNOSIS — R1030 Lower abdominal pain, unspecified: Secondary | ICD-10-CM | POA: Diagnosis present

## 2016-04-02 DIAGNOSIS — O8612 Endometritis following delivery: Secondary | ICD-10-CM | POA: Diagnosis present

## 2016-04-02 DIAGNOSIS — R109 Unspecified abdominal pain: Secondary | ICD-10-CM

## 2016-04-02 LAB — CBC
HCT: 32.3 % — ABNORMAL LOW (ref 36.0–46.0)
Hemoglobin: 10.6 g/dL — ABNORMAL LOW (ref 12.0–15.0)
MCH: 29.1 pg (ref 26.0–34.0)
MCHC: 32.8 g/dL (ref 30.0–36.0)
MCV: 88.7 fL (ref 78.0–100.0)
Platelets: 326 10*3/uL (ref 150–400)
RBC: 3.64 MIL/uL — ABNORMAL LOW (ref 3.87–5.11)
RDW: 15.1 % (ref 11.5–15.5)
WBC: 16.3 10*3/uL — ABNORMAL HIGH (ref 4.0–10.5)

## 2016-04-02 MED ORDER — POLYETHYLENE GLYCOL 3350 17 G PO PACK
17.0000 g | PACK | Freq: Every day | ORAL | Status: DC | PRN
  Administered 2016-04-02: 17 g via ORAL
  Filled 2016-04-02: qty 1

## 2016-04-02 MED ORDER — IBUPROFEN 800 MG PO TABS
800.0000 mg | ORAL_TABLET | Freq: Three times a day (TID) | ORAL | Status: DC | PRN
  Administered 2016-04-03 – 2016-04-04 (×3): 800 mg via ORAL
  Filled 2016-04-02 (×3): qty 1

## 2016-04-02 MED ORDER — ACETAMINOPHEN 500 MG PO TABS
1000.0000 mg | ORAL_TABLET | Freq: Once | ORAL | Status: AC
  Administered 2016-04-02: 1000 mg via ORAL
  Filled 2016-04-02: qty 2

## 2016-04-02 MED ORDER — DEXTROSE 5 % IV SOLN
1.0000 g | Freq: Once | INTRAVENOUS | Status: AC
  Administered 2016-04-02: 1 g via INTRAVENOUS
  Filled 2016-04-02: qty 10

## 2016-04-02 MED ORDER — DEXTROSE 5 % IV SOLN
2.0000 g | Freq: Two times a day (BID) | INTRAVENOUS | Status: DC
  Administered 2016-04-02 – 2016-04-05 (×6): 2 g via INTRAVENOUS
  Filled 2016-04-02 (×6): qty 2

## 2016-04-02 MED ORDER — PRENATAL MULTIVITAMIN CH
1.0000 | ORAL_TABLET | Freq: Every day | ORAL | Status: DC
  Administered 2016-04-04: 1 via ORAL
  Filled 2016-04-02: qty 1

## 2016-04-02 MED ORDER — ZOLPIDEM TARTRATE 5 MG PO TABS
5.0000 mg | ORAL_TABLET | Freq: Every evening | ORAL | Status: DC | PRN
  Administered 2016-04-03: 5 mg via ORAL
  Filled 2016-04-02: qty 1

## 2016-04-02 MED ORDER — HYDROMORPHONE HCL 1 MG/ML IJ SOLN
2.0000 mg | Freq: Once | INTRAMUSCULAR | Status: AC
  Administered 2016-04-02: 2 mg via INTRAVENOUS
  Filled 2016-04-02: qty 2

## 2016-04-02 MED ORDER — SODIUM CHLORIDE 0.9 % IV SOLN: INTRAVENOUS | Status: DC

## 2016-04-02 MED ORDER — SODIUM CHLORIDE 0.9 % IV SOLN
INTRAVENOUS | Status: DC
  Administered 2016-04-02 – 2016-04-04 (×4): via INTRAVENOUS

## 2016-04-02 MED ORDER — OXYCODONE-ACETAMINOPHEN 5-325 MG PO TABS
1.0000 | ORAL_TABLET | ORAL | Status: DC | PRN
  Administered 2016-04-02 – 2016-04-04 (×4): 2 via ORAL
  Administered 2016-04-04: 1 via ORAL
  Administered 2016-04-04 – 2016-04-05 (×2): 2 via ORAL
  Filled 2016-04-02: qty 2
  Filled 2016-04-02: qty 1
  Filled 2016-04-02 (×5): qty 2

## 2016-04-02 NOTE — MAU Note (Signed)
Pt arrived via EMS with c/o  severe lower abd pain that started last night and worsened over the past few hours. Reports vag bleeding has increased with clots. Fever 102 . Pt breastfeeding and Has had two Bm's since vag delivery on May 16.

## 2016-04-02 NOTE — MAU Provider Note (Signed)
History     CSN: [id number]  Arrival date and time: 04/02/16 1412   First Provider Initiated Contact with Patient 04/02/16 1445      No chief complaint on file.  HPIpt is PP s/p vaginal delivery 03/24/2016 by Dr. Vincente Poli.  Pt had GBS and mom and baby had elevated temps. Pt's pain began to increase yesterday- located in abdomen with feeling pain in vagina Pt's temp 102, weak and nauseated.  Pt has taken Percocet and ibuprofen for pain without relief. Pt is breast feeding with full breasts- no red streaks. Pt denies UTI sx.  Pt's bleeding has continued with clots.   RN note:    Expand All Collapse All   Pt arrived via EMS with c/o severe lower abd pain that started last night and worsened over the past few hours. Reports vag bleeding has increased with clots. Fever 102 . Pt breastfeeding and Has had two Bm's since vag delivery on May 16.        Past Medical History  Diagnosis Date  . Headache     Past Surgical History  Procedure Laterality Date  . Dilation and curettage of uterus      Family History  Problem Relation Age of Onset  . Hypertension Mother     Social History  Substance Use Topics  . Smoking status: Never Smoker   . Smokeless tobacco: None  . Alcohol Use: Yes     Comment: DRINK  OCC- NONE  DURING  PREG    Allergies:  Allergies  Allergen Reactions  . Sulfa Antibiotics Hives    Prescriptions prior to admission  Medication Sig Dispense Refill Last Dose  . calcium carbonate (TUMS - DOSED IN MG ELEMENTAL CALCIUM) 500 MG chewable tablet Chew 1 tablet by mouth 3 (three) times daily.   Past Month at Unknown time  . diphenhydramine-acetaminophen (TYLENOL PM) 25-500 MG TABS tablet Take 1 tablet by mouth at bedtime as needed (sleep).   Past Month at Unknown time  . ibuprofen (ADVIL,MOTRIN) 600 MG tablet Take 1 tablet (600 mg total) by mouth every 6 (six) hours. 30 tablet 0 04/02/2016 at 0500  . oxyCODONE-acetaminophen (ROXICET) 5-325 MG tablet Take 1-2  tablets by mouth every 4 (four) hours as needed for severe pain. 30 tablet 0 04/02/2016 at 0500  . Prenatal Vit-Fe Fumarate-FA (PRENATAL MULTIVITAMIN) TABS tablet Take 1 tablet by mouth daily at 12 noon.    Past Month at Unknown time    Review of Systems  Constitutional: Positive for fever, chills and malaise/fatigue.  Gastrointestinal: Positive for nausea and abdominal pain.  Genitourinary: Negative for dysuria.  Musculoskeletal: Negative for back pain.   Physical Exam   Blood pressure 138/78, pulse 137, temperature 102 F (38.9 C), height  (1.676 m), weight 190 lb (86.183 kg), currently breastfeeding.  Physical Exam  Nursing note and vitals reviewed. Constitutional: She is oriented to person, place, and time. She appears well-developed and well-nourished.  temp 102  Eyes: Pupils are equal, round, and reactive to light.  Neck: Normal range of motion. Neck supple.  Cardiovascular:  tachycardic  Respiratory: Effort normal.  GI: She exhibits distension. There is tenderness. There is guarding.  Diffuse abd tenderness- painful with palpation and with rebound  Musculoskeletal: Normal range of motion.  Neurological: She is alert and oriented to person, place, and time.  Skin: Skin is warm. She is diaphoretic. There is pallor.  Psychiatric: She has a normal mood and affect.    MAU Course  Procedures Discussed  with Dr. Rana SnareLowe- will order cbc, rocephin 1 gm IV, tylenol 1gm and ultrasound Tylenol 1 gm PO No blood cultures Results for orders placed or performed during the hospital encounter of 04/02/16 (from the past 48 hour(s))  CBC     Status: Abnormal   Collection Time: 04/02/16  3:11 PM  Result Value Ref Range   WBC 16.3 (H) 4.0 - 10.5 K/uL   RBC 3.64 (L) 3.87 - 5.11 MIL/uL   Hemoglobin 10.6 (L) 12.0 - 15.0 g/dL   HCT 16.132.3 (L) 09.636.0 - 04.546.0 %   MCV 88.7 78.0 - 100.0 fL   MCH 29.1 26.0 - 34.0 pg   MCHC 32.8 30.0 - 36.0 g/dL   RDW 40.915.1 81.111.5 - 91.415.5 %   Platelets 326 150 - 400  K/uL  [place] Pelvis Complete  04/02/2016  CLINICAL DATA:  Severe lower abdominal pain and vaginal bleeding. Passing clots. Gravida 2 para 1 SAB 1. Fever. Patient delivered 03/24/2016. EXAM: TRANSABDOMINAL ULTRASOUND OF PELVIS TECHNIQUE: Transabdominal ultrasound examination of the pelvis was performed including evaluation of the uterus, ovaries, adnexal regions, and pelvic cul-de-sac. COMPARISON:  None. FINDINGS: Uterus Measurements: 16.2 x 7.1 x 11.6 cm. Small amount of fluid and debris identified within the endometrial canal. The anterior aspect of the uterine wall appears heterogeneous and hypoechoic compare with the posterior wall, which appears smooth and well defined. This raises question of retained placenta along the anterior wall. Correlation with prenatal ultrasound is recommended for location of the placenta. Endometrium Thickness: The endometrium itself is difficult to visualized. See above. Right ovary Measurements: 3.5 x 2.3 x 1.3 cm. Normal appearance/no adnexal mass. Left ovary Measurements: 2.8 x 1.9 x 1.8 cm. Normal appearance/no adnexal mass. Other findings:  No abnormal free fluid. IMPRESSION: 1. Fluid within the endometrial canal, associated with minimal debris. 2. Possible retained fragment of placenta along the anterior endometrium. 3. Alternatively the irregularity of the endometrium could be related to endometritis. Electronically Signed   By: Norva PavlovElizabeth  Brown M.D.   On: 04/02/2016 16:29  discussed with Dr. Rana SnareLowe Pt's temp remains at 41102 F after Tylenol 1 gm; pt extremely uncomfortable Dr. Rana SnareLowe to call back with plan Assessment and Plan    LINEBERRY,SUSAN 04/02/2016, 2:51 PM

## 2016-04-02 NOTE — H&P (Signed)
HPIpt is PP s/p vaginal delivery 03/24/2016 by Dr. Vincente PoliGrewal. Pt had GBS and mom and baby had elevated temps. Pt's pain began to increase yesterday- located in abdomen with feeling pain in vagina Pt's temp 102, weak and nauseated. Pt has taken Percocet and ibuprofen for pain without relief. Pt is breast feeding with full breasts- no red streaks. Pt denies UTI sx. Pt's bleeding has continued with clots.   RN note:    Expand All Collapse All  Pt arrived via EMS with c/o severe lower abd pain that started last night and worsened over the past few hours. Reports vag bleeding has increased with clots. Fever 102 . Pt breastfeeding and Has had two Bm's since vag delivery on May 16.        Past Medical History  Diagnosis Date  . Headache     Past Surgical History  Procedure Laterality Date  . Dilation and curettage of uterus      Family History  Problem Relation Age of Onset  . Hypertension Mother     Social History  Substance Use Topics  . Smoking status: Never Smoker   . Smokeless tobacco: None  . Alcohol Use: Yes     Comment: DRINK OCC- NONE DURING PREG    Allergies:  Allergies  Allergen Reactions  . Sulfa Antibiotics Hives    Prescriptions prior to admission  Medication Sig Dispense Refill Last Dose  . calcium carbonate (TUMS - DOSED IN MG ELEMENTAL CALCIUM) 500 MG chewable tablet Chew 1 tablet by mouth 3 (three) times daily.   Past Month at Unknown time  . diphenhydramine-acetaminophen (TYLENOL PM) 25-500 MG TABS tablet Take 1 tablet by mouth at bedtime as needed (sleep).   Past Month at Unknown time  . ibuprofen (ADVIL,MOTRIN) 600 MG tablet Take 1 tablet (600 mg total) by mouth every 6 (six) hours. 30 tablet 0 04/02/2016 at 0500  . oxyCODONE-acetaminophen (ROXICET) 5-325 MG tablet Take 1-2 tablets by mouth every 4 (four) hours as needed for severe pain. 30 tablet 0 04/02/2016 at  0500  . Prenatal Vit-Fe Fumarate-FA (PRENATAL MULTIVITAMIN) TABS tablet Take 1 tablet by mouth daily at 12 noon.    Past Month at Unknown time    Review of Systems  Constitutional: Positive for fever, chills and malaise/fatigue.  Gastrointestinal: Positive for nausea and abdominal pain.  Genitourinary: Negative for dysuria.  Musculoskeletal: Negative for back pain.   Physical Exam   Blood pressure 138/78, pulse 137, temperature 102 F (38.9 C), height 5\' 6"  (1.676 m), weight 190 lb (86.183 kg), currently breastfeeding.  Physical Exam  Nursing note and vitals reviewed. Constitutional: She is oriented to person, place, and time. She appears well-developed and well-nourished.  temp 102  Eyes: Pupils are equal, round, and reactive to light.  Neck: Normal range of motion. Neck supple.  Cardiovascular:  tachycardic  Respiratory: Effort normal.  GI: She exhibits distension. There is tenderness. There is guarding.  Diffuse abd tenderness- painful with palpation and with rebound  Musculoskeletal: Normal range of motion.  Neurological: She is alert and oriented to person, place, and time.  Skin: Skin is warm. She is diaphoretic. There is pallor.  Psychiatric: She has a normal mood and affect.    MAU Course  Procedures Discussed with Dr. Rana SnareLowe- will order cbc, rocephin 1 gm IV, tylenol 1gm and ultrasound Tylenol 1 gm PO No blood cultures Results for orders placed or performed during the hospital encounter of 04/02/16 (from the past 48 hour(s))  CBC Status: Abnormal  Collection Time: 04/02/16 3:11 PM  Result Value Ref Range   WBC 16.3 (H) 4.0 - 10.5 K/uL   RBC 3.64 (L) 3.87 - 5.11 MIL/uL   Hemoglobin 10.6 (L) 12.0 - 15.0 g/dL   HCT 16.1 (L) 09.6 - 04.5 %   MCV 88.7 78.0 - 100.0 fL   MCH 29.1 26.0 - 34.0 pg   MCHC 32.8 30.0 - 36.0 g/dL   RDW 40.9 81.1 - 91.4 %   Platelets 326 150 - 400 K/uL  US Pelvis  Complete  04/02/2016 CLINICAL DATA: Severe lower abdominal pain and vaginal bleeding. Passing clots. Gravida 2 para 1 SAB 1. Fever. Patient delivered 03/24/2016. EXAM: TRANSABDOMINAL ULTRASOUND OF PELVIS TECHNIQUE: Transabdominal ultrasound examination of the pelvis was performed including evaluation of the uterus, ovaries, adnexal regions, and pelvic cul-de-sac. COMPARISON: None. FINDINGS: Uterus Measurements: 16.2 x 7.1 x 11.6 cm. Small amount of fluid and debris identified within the endometrial canal. The anterior aspect of the uterine wall appears heterogeneous and hypoechoic compare with the posterior wall, which appears smooth and well defined. This raises question of retained placenta along the anterior wall. Correlation with prenatal ultrasound is recommended for location of the placenta. Endometrium Thickness: The endometrium itself is difficult to visualized. See above. Right ovary Measurements: 3.5 x 2.3 x 1.3 cm. Normal appearance/no adnexal mass. Left ovary Measurements: 2.8 x 1.9 x 1.8 cm. Normal appearance/no adnexal mass. Other findings: No abnormal free fluid. IMPRESSION: 1. Fluid within the endometrial canal, associated with minimal debris. 2. Possible retained fragment of placenta along the anterior endometrium. 3. Alternatively the irregularity of the endometrium could be related to endometritis. Electronically Signed By: Norva Pavlov M.D. On: 04/02/2016 16:29  discussed with Dr. Rana Snare Pt's temp remains at 20 F after Tylenol 1 gm; pt extremely uncomfortable Dr. Rana Snare to call back with plan Assessment and Plan    LINEBERRY,SUSAN 04/02/2016, 2:51 PM        04/02/16 1730 I reviewed the [place] report and pictures and discussed with our office [place] tech.  Mostly suspicious for edema c/w endometritis and no obvious retained POC seen.  Given the patient appears ill and has elevated WBC, will admit for IVF and IV abx.  Plan IV cefotetan and will repeat CBC in am.  If not improving,  consider D&E or even endometrial cultures. DL

## 2016-04-03 ENCOUNTER — Ambulatory Visit (HOSPITAL_COMMUNITY): Admission: RE | Admit: 2016-04-03 | Payer: BC Managed Care – PPO | Source: Ambulatory Visit

## 2016-04-03 LAB — CBC WITH DIFFERENTIAL/PLATELET
Basophils Absolute: 0 10*3/uL (ref 0.0–0.1)
Basophils Relative: 0 %
Eosinophils Absolute: 0 10*3/uL (ref 0.0–0.7)
Eosinophils Relative: 0 %
HCT: 29.2 % — ABNORMAL LOW (ref 36.0–46.0)
Hemoglobin: 9.5 g/dL — ABNORMAL LOW (ref 12.0–15.0)
Lymphocytes Relative: 16 %
Lymphs Abs: 2.5 10*3/uL (ref 0.7–4.0)
MCH: 29 pg (ref 26.0–34.0)
MCHC: 32.5 g/dL (ref 30.0–36.0)
MCV: 89 fL (ref 78.0–100.0)
Monocytes Absolute: 0.7 10*3/uL (ref 0.1–1.0)
Monocytes Relative: 4 %
Neutro Abs: 12.1 10*3/uL — ABNORMAL HIGH (ref 1.7–7.7)
Neutrophils Relative %: 80 %
Platelets: 376 10*3/uL (ref 150–400)
RBC: 3.28 MIL/uL — ABNORMAL LOW (ref 3.87–5.11)
RDW: 15.4 % (ref 11.5–15.5)
WBC: 15.3 10*3/uL — ABNORMAL HIGH (ref 4.0–10.5)

## 2016-04-03 NOTE — Progress Notes (Signed)
Patient ID: Irven ShellingCatherine Park, female   DOB: June 08, 1991, 25 y.o.   MRN: [id number] S/  Resting, says she feels some better  O//BP 132/77 mmHg  Pulse 119  Temp(Src) 99 F (37.2 C) (Oral)  Resp 18  Ht 5\' 6"  (1.676 m)  Wt 190 lb (86.183 kg)  BMI 30.68 kg/m2  SpO2 97%  Breastfeeding? Yes   Temp max after ABX 100.9 Abd fundus firm, mod tender, abd otherwise soft + BS, no rebound CBC    Component Value Date/Time   WBC 15.3* 04/03/2016 0528   RBC 3.28* 04/03/2016 0528   HGB 9.5* 04/03/2016 0528   HCT 29.2* 04/03/2016 0528   PLT 376 04/03/2016 0528   MCV 89.0 04/03/2016 0528   MCH 29.0 04/03/2016 0528   MCHC 32.5 04/03/2016 0528   RDW 15.4 04/03/2016 0528   LYMPHSABS 2.5 04/03/2016 0528   MONOABS 0.7 04/03/2016 0528   EOSABS 0.0 04/03/2016 0528   BASOSABS 0.0 04/03/2016 0528     A+P// PP endometritis, cont ABX, check labs in AM

## 2016-04-03 NOTE — Progress Notes (Addendum)
Mom readmitted with fever and bleeding yesterday. Has OP appointment scheduled for this morning. Baby with grandparents at present. Mom reports breast feeding has been going much better the last few days,. Reports baby has been latching well with no pain. Concerned about baby's weight. Encouraged dad to call when baby gets here and I will weight baby in office downstairs. Mom has pumped a few times since admission. Reports baby will stay with her today. Encouraged to pump q 3 hours if baby is not with her and nursing. No further questions at present.   Dad weighed baby with assist from secretary  Weight today 8 lbs 3 oz- [facility]

## 2016-04-04 LAB — CBC
HCT: 27.1 % — ABNORMAL LOW (ref 36.0–46.0)
Hemoglobin: 8.7 g/dL — ABNORMAL LOW (ref 12.0–15.0)
MCH: 28.5 pg (ref 26.0–34.0)
MCHC: 32.1 g/dL (ref 30.0–36.0)
MCV: 88.9 fL (ref 78.0–100.0)
Platelets: 323 10*3/uL (ref 150–400)
RBC: 3.05 MIL/uL — ABNORMAL LOW (ref 3.87–5.11)
RDW: 15.4 % (ref 11.5–15.5)
WBC: 12.1 10*3/uL — ABNORMAL HIGH (ref 4.0–10.5)

## 2016-04-04 NOTE — Progress Notes (Signed)
Patient ID: Anna ShellingCatherine Park, female   DOB: October 04, 1991, 25 y.o.   MRN: [id number]   Day 2 IV ABX  S// feeling better O//BP 129/73 mmHg  Pulse 98  Temp(Src) 99.1 F (37.3 C) (Oral)  Resp 18  Ht 5\' 6"  (1.676 m)  Wt 190 lb (86.183 kg)  BMI 30.68 kg/m2  SpO2 97%  Breastfeeding? Yes  CBC    Component Value Date/Time   WBC 12.1* 04/04/2016 0554   RBC 3.05* 04/04/2016 0554   HGB 8.7* 04/04/2016 0554   HCT 27.1* 04/04/2016 0554   PLT 323 04/04/2016 0554   MCV 88.9 04/04/2016 0554   MCH 28.5 04/04/2016 0554   MCHC 32.1 04/04/2016 0554   RDW 15.4 04/04/2016 0554   LYMPHSABS 2.5 04/03/2016 0528   MONOABS 0.7 04/03/2016 0528   EOSABS 0.0 04/03/2016 0528   BASOSABS 0.0 04/03/2016 0528    A+P// PP endometritis, cont IV ABX today>>home in AM

## 2016-04-05 LAB — CBC
HCT: 28.9 % — ABNORMAL LOW (ref 36.0–46.0)
Hemoglobin: 9.3 g/dL — ABNORMAL LOW (ref 12.0–15.0)
MCH: 28.5 pg (ref 26.0–34.0)
MCHC: 32.2 g/dL (ref 30.0–36.0)
MCV: 88.7 fL (ref 78.0–100.0)
Platelets: 318 10*3/uL (ref 150–400)
RBC: 3.26 MIL/uL — ABNORMAL LOW (ref 3.87–5.11)
RDW: 15 % (ref 11.5–15.5)
WBC: 9.4 10*3/uL (ref 4.0–10.5)

## 2016-04-05 MED ORDER — AMOXICILLIN-POT CLAVULANATE 875-125 MG PO TABS: 1.0000 | ORAL_TABLET | Freq: Two times a day (BID) | ORAL | Status: DC

## 2016-04-05 NOTE — Discharge Summary (Signed)
Physician Discharge Summary  Patient ID: Irven ShellingCatherine Maclin MRN: [id number] DOB/AGE: 04-Dec-1990 24 y.o.  Admit date: 04/02/2016 Discharge date: 04/05/2016  Admission Diagnoses:PP endometritis  Discharge Diagnoses: same Active Problems:   Endometritis following delivery   Discharged Condition: good  Hospital Course: adm febrile with elevated WBC for IV ABX after US showed low liklihood retainned POC, temp normalized and WBC WNL at D/C, pt feeling much improved with min lochia  Consults: None  Significant Diagnostic Studies: labs:  Results for orders placed or performed during the hospital encounter of 04/02/16 (from the past 48 hour(s))  CBC     Status: Abnormal   Collection Time: 04/04/16  5:54 AM  Result Value Ref Range   WBC 12.1 (H) 4.0 - 10.5 K/uL   RBC 3.05 (L) 3.87 - 5.11 MIL/uL   Hemoglobin 8.7 (L) 12.0 - 15.0 g/dL   HCT 40.927.1 (L) 81.136.0 - 91.446.0 %   MCV 88.9 78.0 - 100.0 fL   MCH 28.5 26.0 - 34.0 pg   MCHC 32.1 30.0 - 36.0 g/dL   RDW 78.215.4 95.611.5 - 21.315.5 %   Platelets 323 150 - 400 K/uL  CBC     Status: Abnormal   Collection Time: 04/05/16  5:07 AM  Result Value Ref Range   WBC 9.4 4.0 - 10.5 K/uL   RBC 3.26 (L) 3.87 - 5.11 MIL/uL   Hemoglobin 9.3 (L) 12.0 - 15.0 g/dL   HCT 08.628.9 (L) 57.836.0 - 46.946.0 %   MCV 88.7 78.0 - 100.0 fL   MCH 28.5 26.0 - 34.0 pg   MCHC 32.2 30.0 - 36.0 g/dL   RDW 62.915.0 52.811.5 - 41.315.5 %   Platelets 318 150 - 400 K/uL    Treatments: IV hydration and antibiotics: Cefotan  Discharge Exam: Blood pressure 134/83, pulse 106, temperature 99.3 F (37.4 C), temperature source Oral, resp. rate 18, height 5\' 6"  (1.676 m), weight 190 lb (86.183 kg), SpO2 96 %, currently breastfeeding. abd soft + BS, fundus firm, min tenderness  Disposition: 01-Home or Self Care     Medication List    ASK your doctor about these medications        benzocaine-Menthol 20-0.5 % Aero  Commonly known as:  DERMOPLAST  Apply 1 application topically 4 (four) times daily as needed  for irritation.     ibuprofen 600 MG tablet  Commonly known as:  ADVIL,MOTRIN  Take 1 tablet (600 mg total) by mouth every 6 (six) hours.     oxyCODONE-acetaminophen 5-325 MG tablet  Commonly known as:  ROXICET  Take 1-2 tablets by mouth every 4 (four) hours as needed for severe pain.     prenatal multivitamin Tabs tablet  Take 1 tablet by mouth daily at 12 noon.     STOOL SOFTENER LAXATIVE PO  Take 2 tablets by mouth daily.     witch hazel-glycerin pad  Commonly known as:  TUCKS  Apply 1 application topically as needed for itching.           Follow-up Information    Follow up with LOWE,DAVID C, MD In 3 days.   Specialty:  Obstetrics and Gynecology   Why:  office will call   Contact information:   [address], [address] [place] [address] [phone]       Signed: Meriel PicaHOLLAND,Brittanee Ghazarian M 04/05/2016, 9:30 AM

## 2016-04-05 NOTE — Progress Notes (Signed)
Pt discharged to home with husband.  Condition stable. Discharge instructions reviewed with both pt and husband together.  Pt ambulated to car with A. Sheria Langameron, NT. No equipment for home ordered at discharge.

## 2016-05-13 ENCOUNTER — Telehealth (HOSPITAL_COMMUNITY): Payer: Self-pay | Admitting: Lactation Services

## 2016-05-13 NOTE — Telephone Encounter (Signed)
Pt called, interested about relactating. She is 7 weeks postpartum and had quit nursing 1 week ago. She has since changed her mind and would like to relactate. We discussed this at length & she understands that the 1st step is to resume pumping w/a DEBP 8 or more times/day. I also suggested that she do a few minutes of hand expression on each breast after pumping. She may also put her infant daughter to the breast.  Mom also informed that since her menses has returned, she may need to speak with her OB/GYN about combination oral contraceptives to mimic the pregnancy state (and then stopping its usage) to improve the likelihood of relactating. I encouraged her to look at the excellent resource on www.kellymom.com about relactating.  Anna HewKim [name], RN, IBCLC

## 2017-02-01 IMAGING — US US PELVIS COMPLETE
1 series · 15 of 25 positions shown · non-contrast
Comparison: None.

CLINICAL DATA: Severe lower abdominal pain and vaginal bleeding.
Passing clots. Gravida 2 para 1 SAB 1. Fever. Patient delivered
03/24/2016.

EXAM:
TRANSABDOMINAL ULTRASOUND OF PELVIS
TECHNIQUE: Transabdominal ultrasound examination of the pelvis was performed
including evaluation of the uterus, ovaries, adnexal regions, and
pelvic cul-de-sac.

[Series 1: us pelvis complete · 15 of 26 slices shown]
[im 1/26]
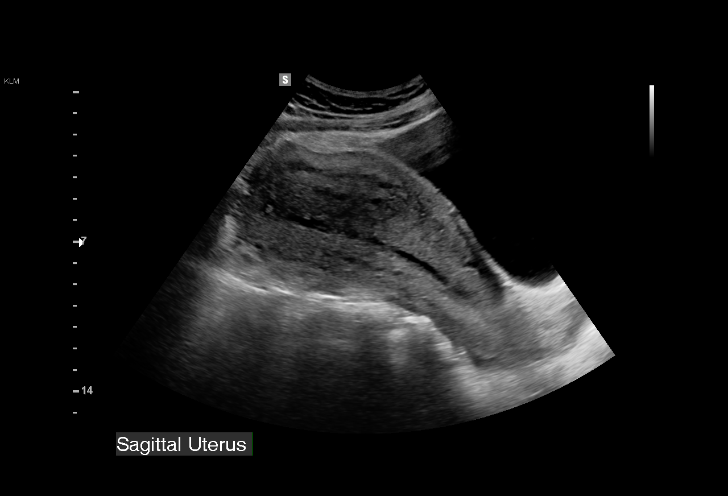
[im 3/26]
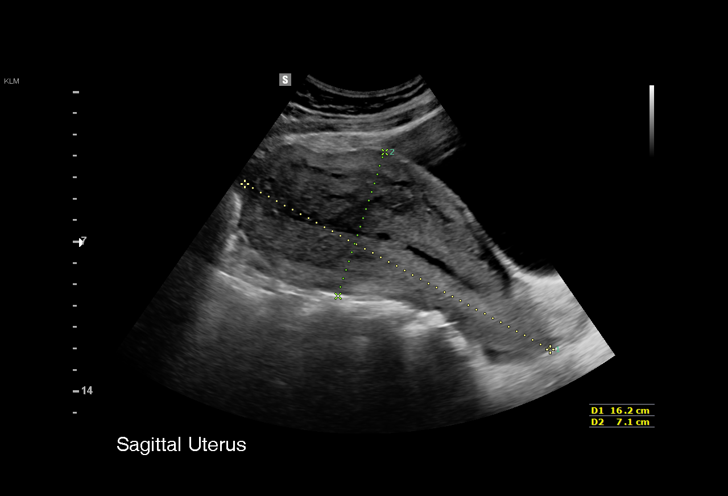
[im 5/26]
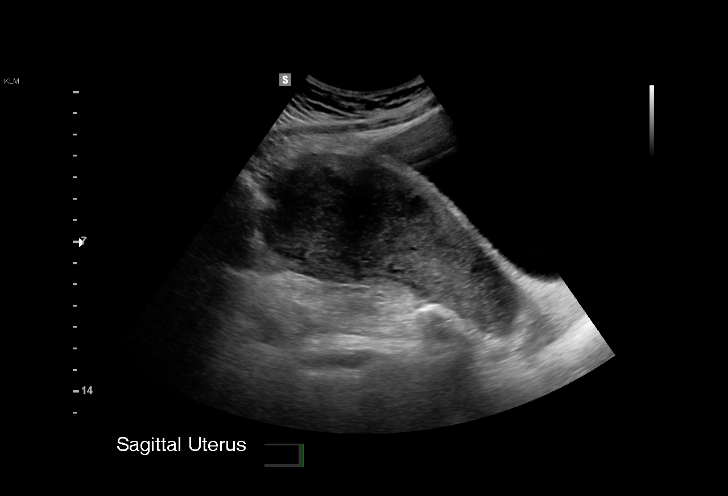
[im 6/26]
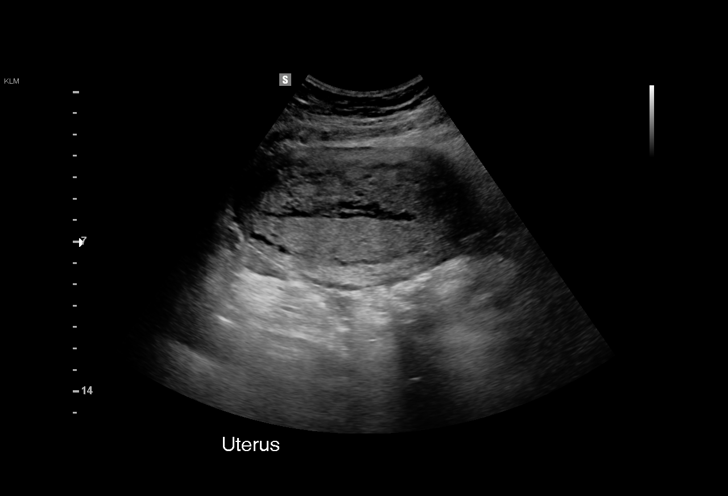
[im 8/26]
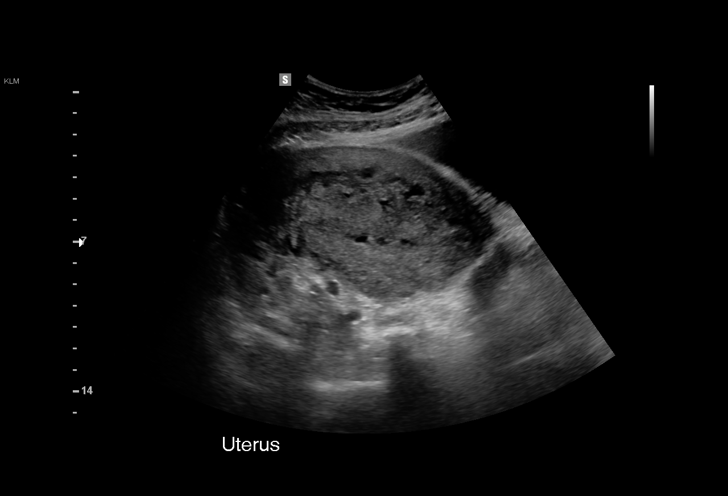
[im 10/26]
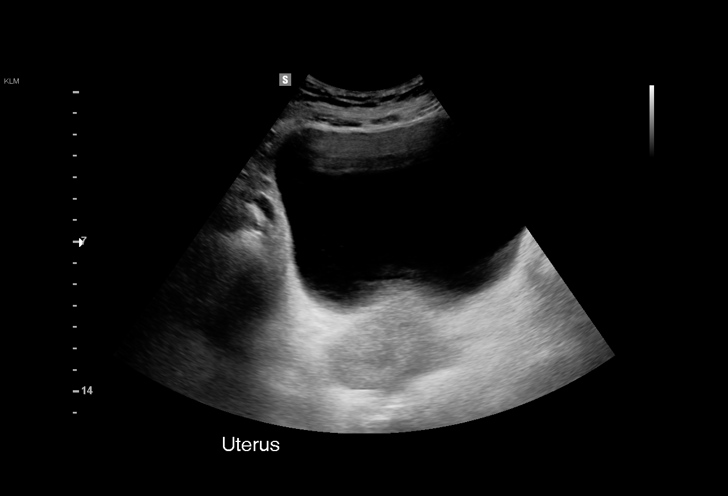
[im 11/26]
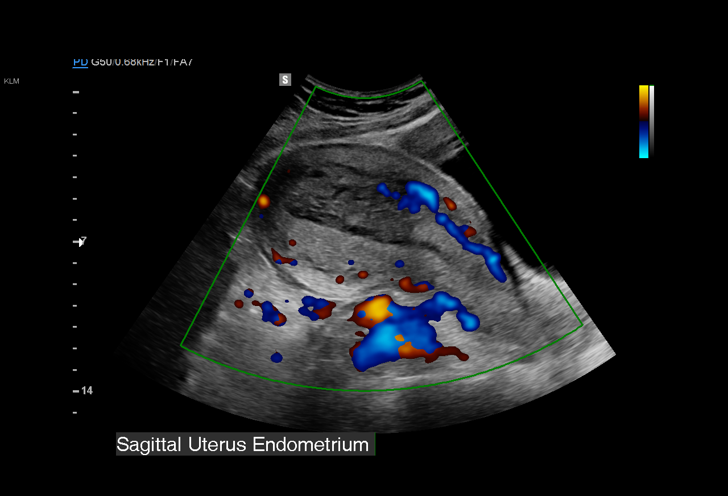
[im 13/26]
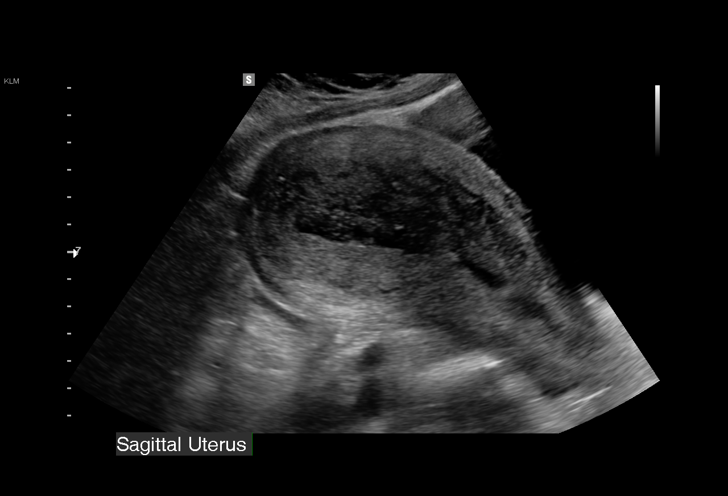
[im 15/26]
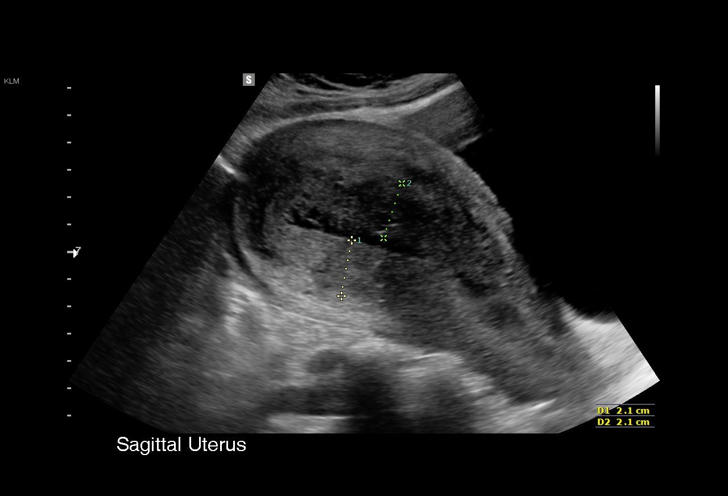
[im 16/26]
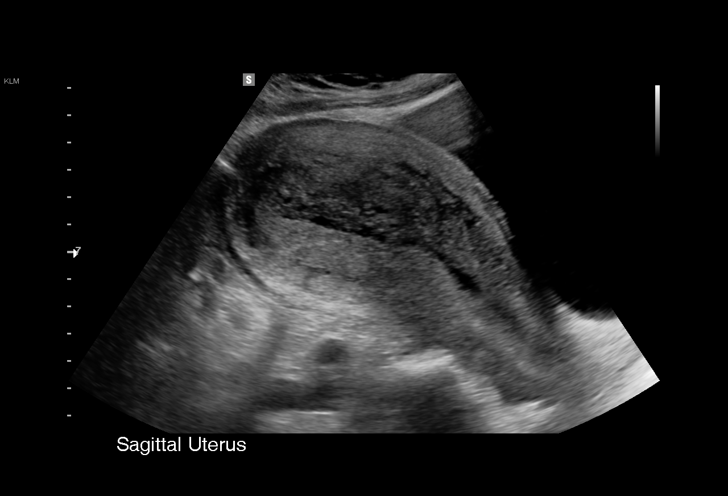
[im 18/26]
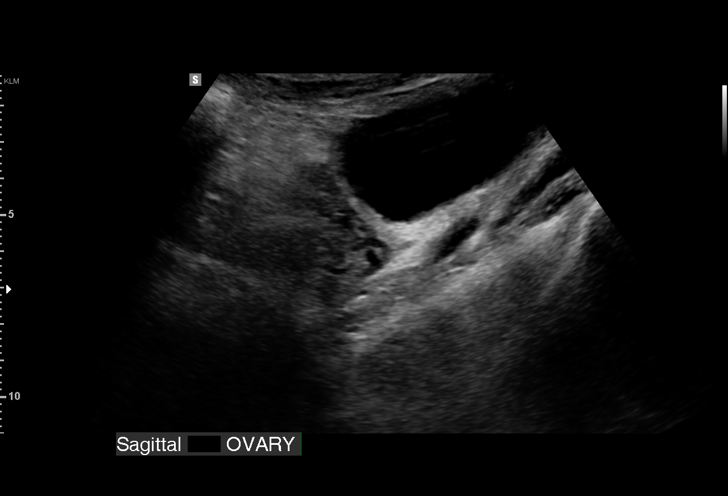
[im 20/26]
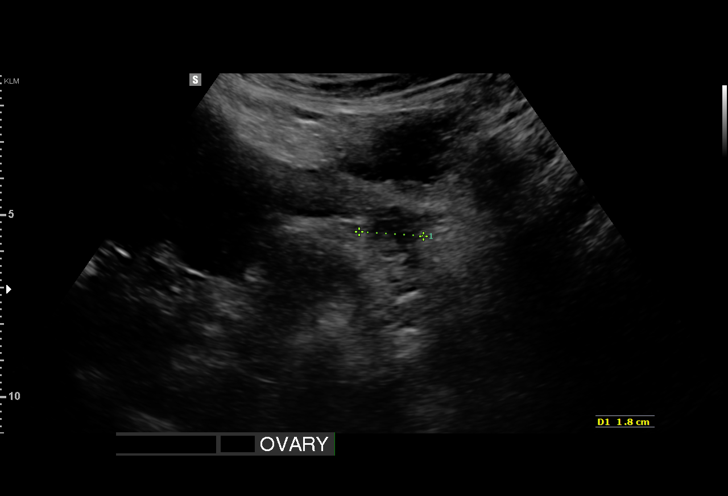
[im 21/26]
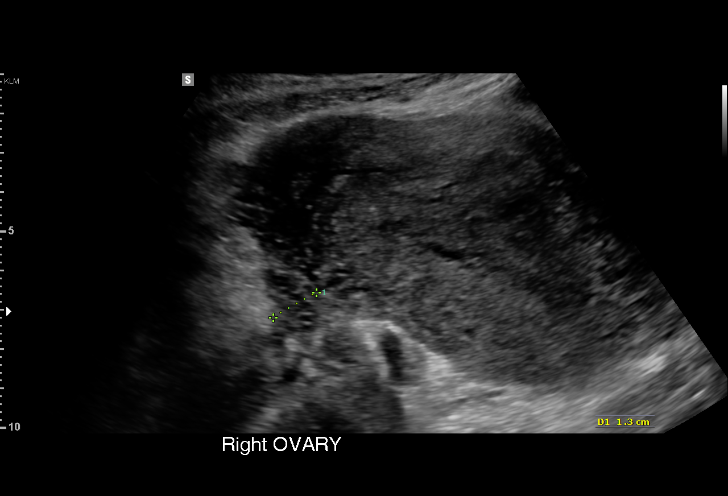
[im 23/26]
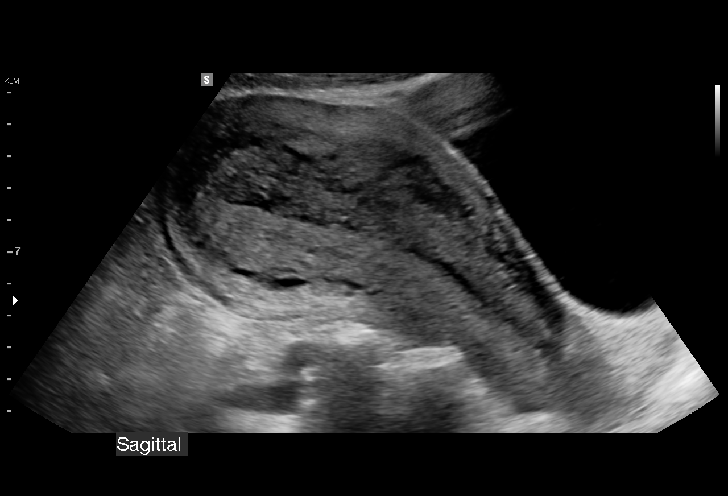
[im 26/26]
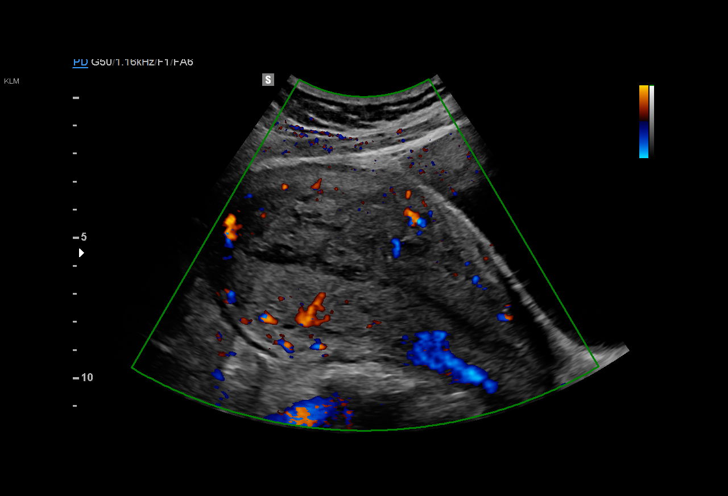

[15 of 25 positions shown; findings below may reference images not displayed]

FINDINGS: Uterus

Measurements: 16.2 x 7.1 x 11.6 cm. Small amount of fluid and debris
identified within the endometrial canal. The anterior aspect of the
uterine wall appears heterogeneous and hypoechoic compare with the
posterior wall, which appears smooth and well defined. This raises
question of retained placenta along the anterior wall. Correlation
with prenatal ultrasound is recommended for location of the
placenta.

Endometrium

Thickness: The endometrium itself is difficult to visualized. See
above.

Right ovary

Measurements: 3.5 x 2.3 x 1.3 cm. Normal appearance/no adnexal mass.

Left ovary

Measurements: 2.8 x 1.9 x 1.8 cm. Normal appearance/no adnexal mass.

Other findings:  No abnormal free fluid.
IMPRESSION: 1. Fluid within the endometrial canal, associated with minimal
debris.
2. Possible retained fragment of placenta along the anterior
endometrium.
3. Alternatively the irregularity of the endometrium could be
related to endometritis.

## 2020-06-02 ENCOUNTER — Inpatient Hospital Stay (EMERGENCY_DEPARTMENT_HOSPITAL)
Admission: AD | Admit: 2020-06-02 | Discharge: 2020-06-02 | Disposition: A | Discharge status: Home / Self Care | Payer: BC Managed Care – PPO | Source: Home / Self Care | Attending: Obstetrics and Gynecology | Admitting: Obstetrics and Gynecology

## 2020-06-02 ENCOUNTER — Inpatient Hospital Stay (HOSPITAL_COMMUNITY)
Admission: AD | Admit: 2020-06-02 | Discharge: 2020-06-02 | Disposition: A | Discharge status: Home / Self Care | Payer: BC Managed Care – PPO | Attending: Obstetrics and Gynecology | Admitting: Obstetrics and Gynecology

## 2020-06-02 ENCOUNTER — Other Ambulatory Visit: Payer: Self-pay

## 2020-06-02 DIAGNOSIS — Z319 Encounter for procreative management, unspecified: Secondary | ICD-10-CM | POA: Insufficient documentation

## 2020-06-02 DIAGNOSIS — Z3202 Encounter for pregnancy test, result negative: Secondary | ICD-10-CM

## 2020-06-02 LAB — URINALYSIS, ROUTINE W REFLEX MICROSCOPIC
Bacteria, UA: NONE SEEN
Bilirubin Urine: NEGATIVE
Glucose, UA: NEGATIVE mg/dL
Ketones, ur: NEGATIVE mg/dL
Leukocytes,Ua: NEGATIVE
Nitrite: NEGATIVE
Protein, ur: NEGATIVE mg/dL
Specific Gravity, Urine: 1.027 (ref 1.005–1.030)
pH: 6 (ref 5.0–8.0)

## 2020-06-02 LAB — POCT PREGNANCY, URINE: Preg Test, Ur: NEGATIVE

## 2020-06-02 NOTE — MAU Note (Signed)
Anna Park is a 29 y.o.  here in MAU reporting: A possible miscarriage yesterday. Reports actively trying to get pregnant.  +lower right abdominal pain that radiates to lower back; sharp x1 week Passed a clot with discoloration yesterday.  Reports bleeding picked up this morning. "I feel off"  LMP: 6/28 Was planning on a pregnancy test this week.  Pain score: 8-9/10; comes in waves Vitals:   06/02/20 1021  BP: 119/83  Pulse: 77  Resp: 18  Temp: 98.5 F (36.9 C)  SpO2: 100%     Lab orders placed from triage: poc pregnancy

## 2020-06-02 NOTE — MAU Provider Note (Signed)
[name] is a 30 y.o. G78P1011 non-pregnant female who presents to MAU today with complaint of vaginal bleeding and possible pregnancy.   O There were no vitals taken for this visit. Physical Exam Vitals and nursing note reviewed.  Constitutional:      General: She is not in acute distress.    Appearance: She is well-developed.  HENT:     Head: Normocephalic.  Eyes:     Pupils: Pupils are equal, round, and reactive to light.  Cardiovascular:     Rate and Rhythm: Normal rate and regular rhythm.     Heart sounds: Normal heart sounds.  Pulmonary:     Effort: Pulmonary effort is normal. No respiratory distress.     Breath sounds: Normal breath sounds.  Abdominal:     General: Bowel sounds are normal. There is no distension.     Palpations: Abdomen is soft.     Tenderness: There is no abdominal tenderness.  Skin:    General: Skin is warm and dry.  Neurological:     Mental Status: She is alert and oriented to person, place, and time.  Psychiatric:        Behavior: Behavior normal.        Thought Content: Thought content normal.        Judgment: Judgment normal.     A Non pregnant female Medical screening exam complete 1. Negative pregnancy test   2. Patient desires pregnancy     P Discharge from MAU in stable condition Patient given the option of transfer to [facility] for further evaluation or seek care in outpatient facility of choice List of options for follow-up given  Warning signs for worsening condition that would warrant emergency follow-up discussed Patient may return to MAU as needed for pregnancy related complaints  Rolm Bookbinder, CNM 06/02/2020 10:41 AM

## 2020-06-02 NOTE — Discharge Instructions (Signed)
Preparing for Pregnancy °If you are considering becoming pregnant, make an appointment to see your regular health care provider to learn how to prepare for a safe and healthy pregnancy (preconception care). During a preconception care visit, your health care provider will: °· Do a complete physical exam, including a Pap test. °· Take a complete medical history. °· Give you information, answer your questions, and help you resolve problems. °Preconception checklist °Medical history °· Tell your health care provider about any current or past medical conditions. Your pregnancy or your ability to become pregnant may be affected by chronic conditions, such as diabetes, chronic hypertension, and thyroid problems. °· Include your family's medical history as well as your partner's medical history. °· Tell your health care provider about any history of STIs (sexually transmitted infections). These can affect your pregnancy. In some cases, they can be passed to your baby. Discuss any concerns that you have about STIs. °· If indicated, discuss the benefits of genetic testing. This testing will show whether there are any genetic conditions that may be passed from you or your partner to your baby. °· Tell your health care provider about: °? Any problems you have had with conception or pregnancy. °? Any medicines you take. These include vitamins, herbal supplements, and over-the-counter medicines. °? Your history of immunizations. Discuss any vaccinations that you may need. °Diet °· Ask your health care provider what to include in a healthy diet that has a balance of nutrients. This is especially important when you are pregnant or preparing to become pregnant. °· Ask your health care provider to help you reach a healthy weight before pregnancy. °? If you are overweight, you may be at higher risk for certain complications, such as high blood pressure, diabetes, and preterm birth. °? If you are underweight, you are more likely to  have a baby who has a low birth weight. °Lifestyle, work, and home °· Let your health care provider know: °? About any lifestyle habits that you have, such as alcohol use, drug use, or smoking. °? About recreational activities that may put you at risk during pregnancy, such as downhill skiing and certain exercise programs. °? Tell your health care provider about any international travel, especially any travel to places with an active Zika virus outbreak. °? About harmful substances that you may be exposed to at work or at home. These include chemicals, pesticides, radiation, or even litter boxes. °? If you do not feel safe at home. °Mental health °· Tell your health care provider about: °? Any history of mental health conditions, including feelings of depression, sadness, or anxiety. °? Any medicines that you take for a mental health condition. These include herbs and supplements. °Home instructions to prepare for pregnancy °Lifestyle ° °· Eat a balanced diet. This includes fresh fruits and vegetables, whole grains, lean meats, low-fat dairy products, healthy fats, and foods that are high in fiber. Ask to meet with a nutritionist or registered dietitian for assistance with meal planning and goals. °· Get regular exercise. Try to be active for at least 30 minutes a day on most days of the week. Ask your health care provider which activities are safe during pregnancy. °· Do not use any products that contain nicotine or tobacco, such as cigarettes and e-cigarettes. If you need help quitting, ask your health care provider. °· Do not drink alcohol. °· Do not take illegal drugs. °· Maintain a healthy weight. Ask your health care provider what weight range is right for you. °General   instructions °· Keep an accurate record of your menstrual periods. This makes it easier for your health care provider to determine your baby's due date. °· Begin taking prenatal vitamins and folic acid supplements daily as directed by your  health care provider. °· Manage any chronic conditions, such as high blood pressure and diabetes, as told by your health care provider. This is important. °How do I know that I am pregnant? °You may be pregnant if you have been sexually active and you miss your period. Symptoms of early pregnancy include: °· Mild cramping. °· Very light vaginal bleeding (spotting). °· Feeling unusually tired. °· Nausea and vomiting (morning sickness). °If you have any of these symptoms and you suspect that you might be pregnant, you can take a home pregnancy test. These tests check for a hormone in your urine (human chorionic gonadotropin, or hCG). A woman's body begins to make this hormone during early pregnancy. These tests are very accurate. Wait until at least the first day after you miss your period to take one. If the test shows that you are pregnant (you get a positive result), call your health care provider to make an appointment for prenatal care. °What should I do if I become pregnant? ° °  ° °· Make an appointment with your health care provider as soon as you suspect you are pregnant. °· Do not use any products that contain nicotine, such as cigarettes, chewing tobacco, and e-cigarettes. If you need help quitting, ask your health care provider. °· Do not drink alcoholic beverages. Alcohol is related to a number of birth defects. °· Avoid toxic odors and chemicals. °· You may continue to have sexual intercourse if it does not cause pain or other problems, such as vaginal bleeding. °This information is not intended to replace advice given to you by your health care provider. Make sure you discuss any questions you have with your health care provider. °Document Revised: 10/28/2017 Document Reviewed: 05/17/2016 °Elsevier Patient Education © 2020 [employer] ° °

## 2020-07-31 LAB — OB RESULTS CONSOLE HIV ANTIBODY (ROUTINE TESTING)
HIV: NONREACTIVE
HIV: NONREACTIVE

## 2020-07-31 LAB — OB RESULTS CONSOLE HGB/HCT, BLOOD
HCT: 38 (ref 29–41)
Hemoglobin: 12.7

## 2020-07-31 LAB — OB RESULTS CONSOLE ABO/RH: RH Type: POSITIVE

## 2020-07-31 LAB — OB RESULTS CONSOLE RUBELLA ANTIBODY, IGM
Rubella: IMMUNE
Rubella: IMMUNE

## 2020-07-31 LAB — OB RESULTS CONSOLE RPR: RPR: NONREACTIVE

## 2020-07-31 LAB — OB RESULTS CONSOLE GC/CHLAMYDIA
Chlamydia: NEGATIVE
Gonorrhea: NEGATIVE

## 2020-07-31 LAB — OB RESULTS CONSOLE PLATELET COUNT: Platelets: 330

## 2020-07-31 LAB — OB RESULTS CONSOLE HEPATITIS B SURFACE ANTIGEN
Hepatitis B Surface Ag: NEGATIVE
Hepatitis B Surface Ag: NEGATIVE

## 2020-07-31 LAB — SICKLE CELL SCREEN: Sickle Cell Screen: NEGATIVE

## 2020-07-31 LAB — HEPATITIS C ANTIBODY: HCV Ab: NEGATIVE

## 2020-07-31 LAB — OB RESULTS CONSOLE ANTIBODY SCREEN: Antibody Screen: NEGATIVE

## 2020-10-16 ENCOUNTER — Inpatient Hospital Stay (HOSPITAL_COMMUNITY)
Admission: AD | Admit: 2020-10-16 | Discharge: 2020-10-16 | Disposition: A | Discharge status: Home / Self Care | Payer: BC Managed Care – PPO | Attending: Obstetrics and Gynecology | Admitting: Obstetrics and Gynecology

## 2020-10-16 ENCOUNTER — Other Ambulatory Visit: Payer: Self-pay

## 2020-10-16 ENCOUNTER — Inpatient Hospital Stay (HOSPITAL_COMMUNITY): Payer: BC Managed Care – PPO

## 2020-10-16 ENCOUNTER — Encounter (HOSPITAL_COMMUNITY): Payer: Self-pay | Admitting: Obstetrics and Gynecology

## 2020-10-16 DIAGNOSIS — O26892 Other specified pregnancy related conditions, second trimester: Secondary | ICD-10-CM | POA: Diagnosis not present

## 2020-10-16 DIAGNOSIS — Z79899 Other long term (current) drug therapy: Secondary | ICD-10-CM | POA: Insufficient documentation

## 2020-10-16 DIAGNOSIS — R42 Dizziness and giddiness: Secondary | ICD-10-CM | POA: Insufficient documentation

## 2020-10-16 DIAGNOSIS — Z882 Allergy status to sulfonamides status: Secondary | ICD-10-CM | POA: Diagnosis not present

## 2020-10-16 DIAGNOSIS — R079 Chest pain, unspecified: Secondary | ICD-10-CM | POA: Insufficient documentation

## 2020-10-16 DIAGNOSIS — Z3A19 19 weeks gestation of pregnancy: Secondary | ICD-10-CM | POA: Insufficient documentation

## 2020-10-16 DIAGNOSIS — U071 COVID-19: Secondary | ICD-10-CM | POA: Insufficient documentation

## 2020-10-16 DIAGNOSIS — Z7982 Long term (current) use of aspirin: Secondary | ICD-10-CM | POA: Diagnosis not present

## 2020-10-16 DIAGNOSIS — J3489 Other specified disorders of nose and nasal sinuses: Secondary | ICD-10-CM | POA: Insufficient documentation

## 2020-10-16 DIAGNOSIS — R059 Cough, unspecified: Secondary | ICD-10-CM | POA: Diagnosis not present

## 2020-10-16 DIAGNOSIS — R6883 Chills (without fever): Secondary | ICD-10-CM | POA: Diagnosis not present

## 2020-10-16 DIAGNOSIS — O98512 Other viral diseases complicating pregnancy, second trimester: Secondary | ICD-10-CM | POA: Insufficient documentation

## 2020-10-16 LAB — CBC
HCT: 36.5 % (ref 36.0–46.0)
Hemoglobin: 12 g/dL (ref 12.0–15.0)
MCH: 30.5 pg (ref 26.0–34.0)
MCHC: 32.9 g/dL (ref 30.0–36.0)
MCV: 92.9 fL (ref 80.0–100.0)
Platelets: 262 10*3/uL (ref 150–400)
RBC: 3.93 MIL/uL (ref 3.87–5.11)
RDW: 13.7 % (ref 11.5–15.5)
WBC: 9.4 10*3/uL (ref 4.0–10.5)
nRBC: 0 % (ref 0.0–0.2)

## 2020-10-16 LAB — COMPREHENSIVE METABOLIC PANEL
ALT: 14 U/L (ref 0–44)
AST: 26 U/L (ref 15–41)
Albumin: 3.1 g/dL — ABNORMAL LOW (ref 3.5–5.0)
Alkaline Phosphatase: 70 U/L (ref 38–126)
Anion gap: 12 (ref 5–15)
BUN: 6 mg/dL (ref 6–20)
CO2: 21 mmol/L — ABNORMAL LOW (ref 22–32)
Calcium: 8.9 mg/dL (ref 8.9–10.3)
Chloride: 104 mmol/L (ref 98–111)
Creatinine, Ser: 0.39 mg/dL — ABNORMAL LOW (ref 0.44–1.00)
GFR, Estimated: >60 mL/min (ref >60)
Glucose, Bld: 118 mg/dL — ABNORMAL HIGH (ref 70–99)
Potassium: 3.2 mmol/L — ABNORMAL LOW (ref 3.5–5.1)
Sodium: 137 mmol/L (ref 135–145)
Total Bilirubin: 0.6 mg/dL (ref 0.3–1.2)
Total Protein: 6.5 g/dL (ref 6.5–8.1)

## 2020-10-16 LAB — BRAIN NATRIURETIC PEPTIDE: B Natriuretic Peptide: 58.9 pg/mL (ref 0.0–100.0)

## 2020-10-16 LAB — URINALYSIS, ROUTINE W REFLEX MICROSCOPIC
Bilirubin Urine: NEGATIVE
Glucose, UA: NEGATIVE mg/dL
Hgb urine dipstick: NEGATIVE
Ketones, ur: 5 mg/dL — AB
Leukocytes,Ua: NEGATIVE
Nitrite: NEGATIVE
Protein, ur: NEGATIVE mg/dL
Specific Gravity, Urine: 1.01 (ref 1.005–1.030)
pH: 6 (ref 5.0–8.0)

## 2020-10-16 LAB — RESP PANEL BY RT-PCR (FLU A&B, COVID) ARPGX2
Influenza A by PCR: NEGATIVE
Influenza B by PCR: NEGATIVE
SARS Coronavirus 2 by RT PCR: POSITIVE — AB

## 2020-10-16 LAB — TROPONIN I (HIGH SENSITIVITY): Troponin I (High Sensitivity): <2 ng/L (ref <18)

## 2020-10-16 MED ORDER — SODIUM CHLORIDE 0.9 % IV SOLN
Freq: Once | INTRAVENOUS | Status: AC
  Filled 2020-10-16: qty 5

## 2020-10-16 MED ORDER — METHYLPREDNISOLONE SODIUM SUCC 125 MG IJ SOLR: 125.0000 mg | Freq: Once | INTRAMUSCULAR | Status: DC | PRN

## 2020-10-16 MED ORDER — DIPHENHYDRAMINE HCL 50 MG/ML IJ SOLN: 50.0000 mg | Freq: Once | INTRAMUSCULAR | Status: DC | PRN

## 2020-10-16 MED ORDER — SODIUM CHLORIDE 0.9 % IV SOLN: INTRAVENOUS | Status: DC | PRN

## 2020-10-16 MED ORDER — FAMOTIDINE IN NACL 20-0.9 MG/50ML-% IV SOLN: 20.0000 mg | Freq: Once | INTRAVENOUS | Status: DC | PRN

## 2020-10-16 MED ORDER — ALBUTEROL SULFATE HFA 108 (90 BASE) MCG/ACT IN AERS
2.0000 | INHALATION_SPRAY | Freq: Once | RESPIRATORY_TRACT | Status: DC | PRN
  Filled 2020-10-16: qty 6.7

## 2020-10-16 MED ORDER — EPINEPHRINE 0.3 MG/0.3ML IJ SOAJ: 0.3000 mg | Freq: Once | INTRAMUSCULAR | Status: DC | PRN

## 2020-10-16 MED ORDER — SODIUM CHLORIDE 0.9 % IV SOLN: INTRAVENOUS | Status: DC

## 2020-10-16 MED ORDER — EPINEPHRINE 0.3 MG/0.3ML IJ SOAJ
0.3000 mg | Freq: Once | INTRAMUSCULAR | Status: DC | PRN
  Filled 2020-10-16: qty 0.6

## 2020-10-16 NOTE — Discharge Instructions (Signed)
10 Things You Can Do to Manage Your COVID-19 Symptoms at Home If you have possible or confirmed COVID-19: 1. Stay home from work and school. And stay away from other public places. If you must go out, avoid using any kind of public transportation, ridesharing, or taxis. 2. Monitor your symptoms carefully. If your symptoms get worse, call your healthcare provider immediately. 3. Get rest and stay hydrated. 4. If you have a medical appointment, call the healthcare provider ahead of time and tell them that you have or may have COVID-19. 5. For medical emergencies, call 911 and notify the dispatch personnel that you have or may have COVID-19. 6. Cover your cough and sneezes with a tissue or use the inside of your elbow. 7. Wash your hands often with soap and water for at least 20 seconds or clean your hands with an alcohol-based hand sanitizer that contains at least 60% alcohol. 8. As much as possible, stay in a specific room and away from other people in your home. Also, you should use a separate bathroom, if available. If you need to be around other people in or outside of the home, wear a mask. 9. Avoid sharing personal items with other people in your household, like dishes, towels, and bedding. 10. Clean all surfaces that are touched often, like counters, tabletops, and doorknobs. Use household cleaning sprays or wipes according to the label instructions. [email or web address] 05/10/2019 This information is not intended to replace advice given to you by your health care provider. Make sure you discuss any questions you have with your health care provider. Document Revised: 10/12/2019 Document Reviewed: 10/12/2019 Elsevier Patient Education  2020 [employer] Inc[employer]  Safe Medications in Pregnancy   Acne: Benzoyl Peroxide Salicylic Acid  Backache/Headache: Tylenol: 2 regular strength every 4 hours OR              2 Extra strength every 6 hours  Colds/Coughs/Allergies: Benadryl (alcohol free) 25  mg every 6 hours as needed Breath right strips Claritin Cepacol throat lozenges Chloraseptic throat spray Cold-Eeze- up to three times per day Cough drops, alcohol free Flonase (by prescription only) Guaifenesin Mucinex Robitussin DM (plain only, alcohol free) Saline nasal spray/drops Sudafed (pseudoephedrine) & Actifed ** use only after [redacted] weeks gestation and if you do not have high blood pressure Tylenol Vicks Vaporub Zinc lozenges Zyrtec   Constipation: Colace Ducolax suppositories Fleet enema Glycerin suppositories Metamucil Milk of magnesia Miralax Senokot Smooth move tea  Diarrhea: Kaopectate Imodium A-D  *NO pepto Bismol  Hemorrhoids: Anusol Anusol HC Preparation H Tucks  Indigestion: Tums Maalox Mylanta Zantac  Pepcid  Insomnia: Benadryl (alcohol free) 25mg  every 6 hours as needed Tylenol PM Unisom, no Gelcaps  Leg Cramps: Tums MagGel  Nausea/Vomiting:  Bonine Dramamine Emetrol Ginger extract Sea bands Meclizine  Nausea medication to take during pregnancy:  Unisom (doxylamine succinate 25 mg tablets) Take one tablet daily at bedtime. If symptoms are not adequately controlled, the dose can be increased to a maximum recommended dose of two tablets daily (1/2 tablet in the morning, 1/2 tablet mid-afternoon and one at bedtime). Vitamin B6 100mg  tablets. Take one tablet twice a day (up to 200 mg per day).  Skin Rashes: Aveeno products Benadryl cream or 25mg  every 6 hours as needed Calamine Lotion 1% cortisone cream  Yeast infection: Gyne-lotrimin 7 Monistat 7   **If taking multiple medications, please check labels to avoid duplicating the same active ingredients **take medication as directed on the label ** Do not exceed 4000 mg of  tylenol in 24 hours **Do not take medications that contain aspirin or ibuprofen

## 2020-10-16 NOTE — MAU Note (Addendum)
Patient states she has been dealing with a sinus infection for the past 2 months-been on 3 rounds of ABX without relief.  States she hasn't had a COVID test yet. Took her BP earlier and it was 95/58.  Reports lightheadedness/dizziness, nausea and soreness now.  Denies VB/LOF.  Endorses + FM.  Denies fever/chills.

## 2020-10-16 NOTE — MAU Provider Note (Signed)
Chief Complaint: Dizziness and Sinusitis   None     SUBJECTIVE HPI: Anna Park is a 29 y.o. G3P1011 at [redacted]w[redacted]d who presents to maternity admissions reporting congestion, cough, sinus pain, chest pain/pressure, dizziness and body aches x 3 days. She has had a sinus infection off and on x 1 month with sinus pressure but none of the other symptoms, treated with 2 courses of abx.  Three days ago, she started to feel worse, with more congestion and dizziness.  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  She has received both doses of the Pfizer COVID 19 vaccine. She denies fever but reports body aches and some chills.  She started taking azithromycin, a Z-pak, prescribed by her OB/Gyn after she called them yesterday.  There are no other symptoms. She has not tried other treatments.    HPI  Past Medical History:  Diagnosis Date  . Headache    Past Surgical History:  Procedure Laterality Date  . DILATION AND CURETTAGE OF UTERUS     Social History   Socioeconomic History  . Marital status: Married    Spouse name: Not on file  . Number of children: Not on file  . Years of education: Not on file  . Highest education level: Not on file  Occupational History  . Not on file  Tobacco Use  . Smoking status: Never Smoker  . Smokeless tobacco: Never Used  Substance and Sexual Activity  . Alcohol use: Yes    Comment: DRINK  OCC- NONE  DURING  PREG  . Drug use: No  . Sexual activity: Not Currently  Other Topics Concern  . Not on file  Social History Narrative  . Not on file   Social Determinants of Health   Financial Resource Strain:   . Difficulty of Paying Living Expenses: Not on file  Food Insecurity:   . Worried About Programme researcher, broadcasting/film/video in the Last Year: Not on file  . Ran Out of Food in the Last Year: Not on file  Transportation Needs:   . Lack of Transportation (Medical): Not on file  . Lack of Transportation  (Non-Medical): Not on file  Physical Activity:   . Days of Exercise per Week: Not on file  . Minutes of Exercise per Session: Not on file  Stress:   . Feeling of Stress : Not on file  Social Connections:   . Frequency of Communication with Friends and Family: Not on file  . Frequency of Social Gatherings with Friends and Family: Not on file  . Attends Religious Services: Not on file  . Active Member of Clubs or Organizations: Not on file  . Attends Banker Meetings: Not on file  . Marital Status: Not on file  Intimate Partner Violence:   . Fear of Current or Ex-Partner: Not on file  . Emotionally Abused: Not on file  . Physically Abused: Not on file  . Sexually Abused: Not on file   No current facility-administered medications on file prior to encounter.   Current Outpatient Medications on File Prior to Encounter  Medication Sig Dispense Refill  . aspirin 81 MG chewable tablet Chew by mouth daily.    . famotidine (PEPCID) 20 MG tablet Take 20 mg by mouth 2 (two) times daily.    . fexofenadine (ALLEGRA) 180 MG tablet Take 180 mg by mouth daily.    . Prenatal Vit-Fe Fumarate-FA (PRENATAL MULTIVITAMIN) TABS tablet Take  1 tablet by mouth daily at 12 noon.     Bernadette Hoit Sodium (STOOL SOFTENER LAXATIVE PO) Take 2 tablets by mouth daily.    . sertraline (ZOLOFT) 50 MG tablet Take 50 mg by mouth daily.     Allergies  Allergen Reactions  . Sulfa Antibiotics Hives    ROS:  Review of Systems  Constitutional: Positive for chills. Negative for fatigue and fever.  HENT: Positive for congestion, sinus pressure and sinus pain.   Eyes: Negative for visual disturbance.  Respiratory: Negative for shortness of breath.   Cardiovascular: Positive for chest pain.  Gastrointestinal: Negative for abdominal pain, nausea and vomiting.  Genitourinary: Negative for difficulty urinating, dysuria, flank pain, pelvic pain, vaginal bleeding, vaginal discharge and vaginal pain.   Musculoskeletal: Positive for myalgias.  Neurological: Positive for dizziness. Negative for headaches.  Psychiatric/Behavioral: Negative.      I have reviewed patient's Past Medical Hx, Surgical Hx, Family Hx, Social Hx, medications and allergies.   Physical Exam   Patient Vitals for the past 24 hrs:  BP Temp Pulse Resp SpO2 Height Weight  10/16/20 1547 -- -- -- -- 99 % 5\' 6"  (1.676 m) 83.9 kg  10/16/20 1545 (!) 93/47 98.3 F (36.8 C) 96 16 99 % -- --  10/16/20 1135 119/70 98.5 F (36.9 C) 91 18 -- -- 84 kg   Constitutional: Well-developed, well-nourished female in no acute distress.  Cardiovascular: normal rate Respiratory: normal effort GI: Abd soft, non-tender. Pos BS x 4 MS: Extremities nontender, no edema, normal ROM Neurologic: Alert and oriented x 4.  GU: Neg CVAT.  PELVIC EXAM: Cervix pink, visually closed, without lesion, scant white creamy discharge, vaginal walls and external genitalia normal Bimanual exam: Cervix 0/long/high, firm, anterior, neg CMT, uterus nontender, nonenlarged, adnexa without tenderness, enlargement, or mass  FHT Baby A: 145/ Baby B: 150 by doppler  LAB RESULTS Results for orders placed or performed during the hospital encounter of 10/16/20 (from the past 24 hour(s))  Urinalysis, Routine w reflex microscopic Nasopharyngeal Swab     Status: Abnormal   Collection Time: 10/16/20 11:50 AM  Result Value Ref Range   Color, Urine YELLOW YELLOW   APPearance CLEAR CLEAR   Specific Gravity, Urine 1.010 1.005 - 1.030   pH 6.0 5.0 - 8.0   Glucose, UA NEGATIVE NEGATIVE mg/dL   Hgb urine dipstick NEGATIVE NEGATIVE   Bilirubin Urine NEGATIVE NEGATIVE   Ketones, ur 5 (A) NEGATIVE mg/dL   Protein, ur NEGATIVE NEGATIVE mg/dL   Nitrite NEGATIVE NEGATIVE   Leukocytes,Ua NEGATIVE NEGATIVE  Resp Panel by RT-PCR (Flu A&B, Covid) Nasopharyngeal Swab     Status: Abnormal   Collection Time: 10/16/20 12:05 PM   Specimen: Nasopharyngeal Swab; Nasopharyngeal(NP)  swabs in vial transport medium  Result Value Ref Range   SARS Coronavirus 2 by RT PCR POSITIVE (A) NEGATIVE   Influenza A by PCR NEGATIVE NEGATIVE   Influenza B by PCR NEGATIVE NEGATIVE  CBC     Status: None   Collection Time: 10/16/20  3:54 PM  Result Value Ref Range   WBC 9.4 4.0 - 10.5 K/uL   RBC 3.93 3.87 - 5.11 MIL/uL   Hemoglobin 12.0 12.0 - 15.0 g/dL   HCT 40.1 36 - 46 %   MCV 92.9 80.0 - 100.0 fL   MCH 30.5 26.0 - 34.0 pg   MCHC 32.9 30.0 - 36.0 g/dL   RDW 02.7 25.3 - 66.4 %   Platelets 262 150 - 400 K/uL  nRBC 0.0 0.0 - 0.2 %       IMAGING DG Chest Portable 1 View  Result Date: 10/16/2020 CLINICAL DATA:  Chest pain and cough EXAM: PORTABLE CHEST 1 VIEW COMPARISON:  None FINDINGS: Trachea midline. Cardiomediastinal contours and hilar structures are normal. Lungs are clear. On limited assessment no acute skeletal process. IMPRESSION: No acute cardiopulmonary disease. Electronically Signed   By: Donzetta Kohut M.D.   On: 10/16/2020 15:06    MAU Management/MDM: Orders Placed This Encounter  Procedures  . Resp Panel by RT-PCR (Flu A&B, Covid) Nasopharyngeal Swab  . DG Chest Portable 1 View  . Urinalysis, Routine w reflex microscopic  . CBC  . Comprehensive metabolic panel  . Brain natriuretic peptide  . Hypersensitivity GRADE 1: Transient flushing or rash, or drug fever < 100.4 F  . Hypersensitivity GRADE 2: Rash, flushing, urticaria, dyspnea, or drug fever = or > 100.4 F  . Hypersensitivity GRADE 3: symptomatic bronchospasm, with or without urticaria, parenteral medication management indicated, allergy-related edema/angioedema, or hypotension  . Hypersensitivity GRADE 4: Anaphylaxis  . Fetal monitoring  . Assess fetal heart tones  . Provide patient the appropriate monoclonal antibody fact sheet prior to administration  . casirivimab / imdevimab per pharmacy consult  . ED EKG  . Discharge patient    Meds ordered this encounter  Medications  . 0.9 %  sodium  chloride infusion  . casirivimab (REGN 10933) 600 mg, imdevimab (REGN 10987) 600 mg in sodium chloride 0.9 % 110 mL IVPB    Order Specific Question:   I attest that this patient meets the FDA Emergency Use Authorization criteria and has risk factor(s) for progression to severe COVID-19 and/or hospitalization:    Answer:   Yes    Order Specific Question:   High Risk Factor(s) for COVID-19 Progression:    Answer:   Pregnancy  . 0.9 %  sodium chloride infusion  . diphenhydrAMINE (BENADRYL) injection 50 mg  . famotidine (PEPCID) IVPB 20 mg premix  . methylPREDNISolone sodium succinate (SOLU-MEDROL) 125 mg/2 mL injection 125 mg  . albuterol (VENTOLIN HFA) 108 (90 Base) MCG/ACT inhaler 2 puff  . EPINEPHrine (EPI-PEN) injection 0.3 mg  . DISCONTD: EPINEPHrine (EPI-PEN) injection 0.3 mg    Pt COVID positive, CXR normal so no evidence of pneumonia. O2 sats 100% on room air.  Discussed monoclonal antibodies, risks/benefits/alternatives. Pt desires MABs so infusion ordered and given today in MAU. Pt tolerated well.  D/C home with COVID precautions.    ASSESSMENT 1. COVID-19 affecting pregnancy in second trimester     PLAN Discharge home with COVID precautions  Allergies as of 10/16/2020      Reactions   Sulfa Antibiotics Hives      Medication List    TAKE these medications   aspirin 81 MG chewable tablet Chew by mouth daily.   famotidine 20 MG tablet Commonly known as: PEPCID Take 20 mg by mouth 2 (two) times daily.   fexofenadine 180 MG tablet Commonly known as: ALLEGRA Take 180 mg by mouth daily.   prenatal multivitamin Tabs tablet Take 1 tablet by mouth daily at 12 noon.   sertraline 50 MG tablet Commonly known as: ZOLOFT Take 50 mg by mouth daily.   STOOL SOFTENER LAXATIVE PO Take 2 tablets by mouth daily.       Follow-up Information    [place] Follow up.   Contact information: [address] [place]  [address] [phone]  [facility] Follow up.   Specialty: Obstetrics and Gynecology Why: For worsening symptoms or emergencies Contact information: [address] [id number] Wilhemina Bonito [place] [address] [phone]              Sharen Counter Certified Nurse-Midwife 10/16/2020  4:26 PM

## 2020-12-06 LAB — OB RESULTS CONSOLE HIV ANTIBODY (ROUTINE TESTING): HIV: NONREACTIVE

## 2020-12-08 LAB — GLUCOSE TOLERANCE, 1 HOUR: Glucose, 1 Hour GTT: 116

## 2020-12-26 ENCOUNTER — Other Ambulatory Visit: Payer: Self-pay

## 2020-12-26 ENCOUNTER — Encounter (HOSPITAL_COMMUNITY): Payer: Self-pay | Admitting: Obstetrics and Gynecology

## 2020-12-26 ENCOUNTER — Inpatient Hospital Stay (HOSPITAL_COMMUNITY)
Admission: AD | Admit: 2020-12-26 | Discharge: 2020-12-26 | Disposition: A | Discharge status: Home / Self Care | Payer: BC Managed Care – PPO | Attending: Obstetrics and Gynecology | Admitting: Obstetrics and Gynecology

## 2020-12-26 DIAGNOSIS — Z3A29 29 weeks gestation of pregnancy: Secondary | ICD-10-CM | POA: Diagnosis not present

## 2020-12-26 DIAGNOSIS — O4703 False labor before 37 completed weeks of gestation, third trimester: Secondary | ICD-10-CM | POA: Diagnosis not present

## 2020-12-26 DIAGNOSIS — O30043 Twin pregnancy, dichorionic/diamniotic, third trimester: Secondary | ICD-10-CM | POA: Diagnosis not present

## 2020-12-26 LAB — URINALYSIS, ROUTINE W REFLEX MICROSCOPIC
Bilirubin Urine: NEGATIVE
Glucose, UA: NEGATIVE mg/dL
Hgb urine dipstick: NEGATIVE
Ketones, ur: 5 mg/dL — AB
Leukocytes,Ua: NEGATIVE
Nitrite: NEGATIVE
Protein, ur: NEGATIVE mg/dL
Specific Gravity, Urine: 1.015 (ref 1.005–1.030)
pH: 7 (ref 5.0–8.0)

## 2020-12-26 NOTE — MAU Provider Note (Signed)
History     CSN: [id number]  Arrival date and time: 12/26/20 1207   Event Date/Time   First Provider Initiated Contact with Patient 12/26/20 1346      Chief Complaint  Patient presents with   Abdominal Pain   HPI Anna Park is a 30 y.o. G3P1011 at [redacted]w[redacted]d with di/di twins who presents for abdominal pain. Reports intermittent, irregular tightening and cramping for the last 3 days. At times has been as much as 8 times per hour. Reports feeling cramps 3 times since being on monitor for 40+ minutes. Was in the office today; cervix was closed/soft, was referred here for further monitoring & PTL evaluation.  Denies LOF or vaginal bleeding. Good fetal movement x 2. Goes to [facility].   OB History    Gravida  3   Para  1   Term  1   Preterm      AB  1   Living  1     SAB  1   IAB      Ectopic      Multiple  0   Live Births  1           Past Medical History:  Diagnosis Date   Headache     Past Surgical History:  Procedure Laterality Date   DILATION AND CURETTAGE OF UTERUS      Family History  Problem Relation Age of Onset   Hypertension Mother     Social History   Tobacco Use   Smoking status: Never Smoker   Smokeless tobacco: Never Used  Vaping Use   Vaping Use: Never used  Substance Use Topics   Alcohol use: Yes    Comment: DRINK  OCC- NONE  DURING  PREG   Drug use: No    Allergies:  Allergies  Allergen Reactions   Sulfa Antibiotics Hives    Medications Prior to Admission  Medication Sig Dispense Refill Last Dose   aspirin 81 MG chewable tablet Chew by mouth daily.   12/26/2020 at Unknown time   eletriptan (RELPAX) 40 MG tablet eletriptan 40 mg tablet      famotidine (PEPCID) 20 MG tablet Take 20 mg by mouth 2 (two) times daily.   12/26/2020 at Unknown time   fexofenadine (ALLEGRA) 180 MG tablet Take 180 mg by mouth daily.   12/25/2020 at Unknown time   Prenatal Vit-Fe Fumarate-FA (PRENATAL MULTIVITAMIN) TABS  tablet Take 1 tablet by mouth daily at 12 noon.    12/26/2020 at Unknown time   sertraline (ZOLOFT) 50 MG tablet Take 50 mg by mouth daily.   12/25/2020 at Unknown time   Sennosides-Docusate Sodium (STOOL SOFTENER LAXATIVE PO) Take 2 tablets by mouth daily.       Review of Systems  Constitutional: Negative.   Gastrointestinal: Positive for abdominal pain. Negative for constipation, diarrhea, nausea and vomiting.  Genitourinary: Negative.    Physical Exam   Blood pressure 135/88, pulse 84, resp. rate 18, height 5\' 6"  (1.676 m), weight 84.4 kg, currently breastfeeding.  Physical Exam Vitals and nursing note reviewed. Exam conducted with a chaperone present.  Constitutional:      General: She is not in acute distress.    Appearance: She is well-developed.  HENT:     Head: Normocephalic and atraumatic.  Pulmonary:     Effort: Pulmonary effort is normal. No respiratory distress.  Abdominal:     Tenderness: There is no abdominal tenderness.     Comments: Gravid uterus. Ctx palpate  mild  Genitourinary:    Comments: Dilation: Closed Effacement (%): Thick Cervical Position: Posterior Station: Ballotable Exam by:: Judeth Horn NP  Skin:    General: Skin is warm and dry.  Neurological:     Mental Status: She is alert.    Fetal Tracing: Baby A Baseline: 145 Variability: moderate Accelerations: 15x15  Decelerations: none  Baby B Baseline: 140 Variability: moderate Accelerations:10x10 Decelerations: none  Toco: initially Q2-4 minutes, spaced out     MAU Course  Procedures Results for orders placed or performed during the hospital encounter of 12/26/20 (from the past 24 hour(s))  Urinalysis, Routine w reflex microscopic Urine, Clean Catch     Status: Abnormal   Collection Time: 12/26/20  1:28 PM  Result Value Ref Range   Color, Urine YELLOW YELLOW   APPearance CLEAR CLEAR   Specific Gravity, Urine 1.015 1.005 - 1.030   pH 7.0 5.0 - 8.0   Glucose, UA NEGATIVE  NEGATIVE mg/dL   Hgb urine dipstick NEGATIVE NEGATIVE   Bilirubin Urine NEGATIVE NEGATIVE   Ketones, ur 5 (A) NEGATIVE mg/dL   Protein, ur NEGATIVE NEGATIVE mg/dL   Nitrite NEGATIVE NEGATIVE   Leukocytes,Ua NEGATIVE NEGATIVE    MDM Per patient, cervix was checked by Dr. Rana Snare in the office around 1130 am - cervix closed/soft.  In MAU, cervix remains closed x 2 exams > 1 hr apart.  Ctx on monitor spaced out with oral fluids.  Patient reports improvement in symptoms & states she wasn't feeling all of the contractions she was having.   Unable to collect FFN due to exam in the office  Assessment and Plan   1. Preterm uterine contractions in third trimester, antepartum   2. Dichorionic diamniotic twin pregnancy in third trimester   3. [redacted] weeks gestation of pregnancy    -reviewed preterm labor precautions & reasons to return to MAU  Judeth Horn 12/26/2020, 3:22 PM

## 2020-12-26 NOTE — MAU Note (Incomplete)
Pt reports she has been having lower abd pain and back pain for a few days. Went to office to day and was told she was closed but cervix was soft. Dr. Rana Snare wanted her to come in and be monitored and rechecked to make sure she was not in preterm labor. Twin Pregnancy.

## 2020-12-26 NOTE — Discharge Instructions (Signed)
Return to MAU: contractions more than 10 times per hour, leaking watery fluid from your vagina, vaginal bleeding, or change in fetal movement   Preterm Labor The normal length of a pregnancy is 39-41 weeks. Preterm labor is when labor starts before 37 completed weeks of pregnancy. Babies who are born prematurely and survive may not be fully developed and may be at an increased risk for long-term problems such as cerebral palsy, developmental delays, and vision and hearing problems. Babies who are born too early may have problems soon after birth. Problems may include regulating blood sugar, body temperature, heart rate, and breathing rate. These babies often have trouble with feeding. The risk of having problems is highest for babies who are born before 34 weeks of pregnancy. What are the causes? The exact cause of this condition is not known. What increases the risk? You are more likely to have preterm labor if you have certain risk factors that relate to your medical history, problems with present and past pregnancies, and lifestyle factors. Medical history  You have abnormalities of the uterus, including a short cervix.  You have STIs (sexually transmitted infections), or other infections of the urinary tract and the vagina.  You have chronic illnesses, such as blood clotting problems, diabetes, or high blood pressure.  You are overweight or underweight. Present and past pregnancies  You have had preterm labor before.  You are pregnant with twins or other multiples.  You have been diagnosed with a condition in which the placenta covers your cervix (placenta previa).  You waited less than 6 months between giving birth and becoming pregnant again.  Your unborn baby has some abnormalities.  You have vaginal bleeding during pregnancy.  You became pregnant through in vitro fertilization (IVF). Lifestyle and environmental factors  You use tobacco products.  You drink  alcohol.  You use street drugs.  You have stress and no social support.  You experience domestic violence.  You are exposed to certain chemicals or environmental pollutants. Other factors  You are younger than age 56 or older than age 34. What are the signs or symptoms? Symptoms of this condition include:  Cramps similar to those that can happen during a menstrual period. The cramps may happen with diarrhea.  Pain in the abdomen or lower back.  Regular contractions that may feel like tightening of the abdomen.  A feeling of increased pressure in the pelvis.  Increased watery or bloody mucus discharge from the vagina.  Water breaking (ruptured amniotic sac). How is this diagnosed? This condition is diagnosed based on:  Your medical history and a physical exam.  A pelvic exam.  An ultrasound.  Monitoring your uterus for contractions.  Other tests, including: ? A swab of the cervix to check for a chemical called fetal fibronectin. ? Urine tests. How is this treated? Treatment for this condition depends on the length of your pregnancy, your condition, and the health of your baby. Treatment may include:  Taking medicines, such as: ? Hormone medicines. These may be given early in pregnancy to help support the pregnancy. ? Medicines to stop contractions. ? Medicines to help mature the baby's lungs. These may be prescribed if the risk of delivery is high. ? Medicines to prevent your baby from developing cerebral palsy.  Bed rest. If the labor happens before 34 weeks of pregnancy, you may need to stay in the hospital.  Delivery of the baby. Follow these instructions at home:  Do not use any products that contain  nicotine or tobacco, such as cigarettes, e-cigarettes, and chewing tobacco. If you need help quitting, ask your health care provider.  Do not drink alcohol.  Take over-the-counter and prescription medicines only as told by your health care provider.  Rest as  told by your health care provider.  Return to your normal activities as told by your health care provider. Ask your health care provider what activities are safe for you.  Keep all follow-up visits as told by your health care provider. This is important.   How is this prevented? To increase your chance of having a full-term pregnancy:  Do not use street drugs or medicines that have not been prescribed to you during your pregnancy.  Talk with your health care provider before taking any herbal supplements, even if you have been taking them regularly.  Make sure you gain a healthy amount of weight during your pregnancy.  Watch for infection. If you think that you might have an infection, get it checked right away. Symptoms of infection may include: ? Fever. ? Abnormal vaginal discharge or discharge that smells bad. ? Pain or burning with urination. ? Needing to urinate urgently. ? Frequently urinating or passing small amounts of urine frequently. ? Blood in your urine. ? Urine that smells bad or unusual.  Tell your health care provider if you have had preterm labor before. Contact a health care provider if:  You think you are going into preterm labor.  You have signs or symptoms of preterm labor.  You have symptoms of infection. Get help right away if:  You are having regular, painful contractions every 5 minutes or less.  Your water breaks. Summary  Preterm labor is labor that starts before you reach 37 weeks of pregnancy.  Delivering your baby early increases your baby's risk of developing lifelong problems.  The exact cause of preterm labor is unknown. However, having an abnormal uterus, an STI (sexually transmitted infection), or vaginal bleeding during pregnancy increases your risk for preterm labor.  Keep all follow-up visits as told by your health care provider. This is important.  Contact a health care provider if you have signs or symptoms of preterm labor. This  information is not intended to replace advice given to you by your health care provider. Make sure you discuss any questions you have with your health care provider. Document Revised: 11/28/2019 Document Reviewed: 11/28/2019 Elsevier Patient Education  2021 [employer].

## 2021-01-23 ENCOUNTER — Other Ambulatory Visit: Payer: Self-pay

## 2021-01-23 ENCOUNTER — Ambulatory Visit (INDEPENDENT_AMBULATORY_CARE_PROVIDER_SITE_OTHER): Payer: BC Managed Care – PPO | Admitting: *Deleted

## 2021-01-23 VITALS — BP 135/91 | HR 92 | Wt 204.6 lb

## 2021-01-23 DIAGNOSIS — O30043 Twin pregnancy, dichorionic/diamniotic, third trimester: Secondary | ICD-10-CM

## 2021-01-23 NOTE — Progress Notes (Signed)
Pt reports having H/A and dizziness earlier today. She took Tylenol which resolved the H/A. Copy of NST report and tracing sent to Dr. Langston Masker w/pt today.

## 2021-01-24 ENCOUNTER — Encounter (HOSPITAL_COMMUNITY): Payer: Self-pay | Admitting: Obstetrics and Gynecology

## 2021-01-24 ENCOUNTER — Inpatient Hospital Stay (HOSPITAL_COMMUNITY)
Admission: AD | Admit: 2021-01-24 | Discharge: 2021-01-27 | DRG: 833 | Disposition: A | Discharge status: Home / Self Care | Payer: BC Managed Care – PPO | Attending: Obstetrics and Gynecology | Admitting: Obstetrics and Gynecology

## 2021-01-24 ENCOUNTER — Other Ambulatory Visit: Payer: Self-pay

## 2021-01-24 DIAGNOSIS — Z3A34 34 weeks gestation of pregnancy: Secondary | ICD-10-CM | POA: Diagnosis not present

## 2021-01-24 DIAGNOSIS — O1493 Unspecified pre-eclampsia, third trimester: Secondary | ICD-10-CM

## 2021-01-24 DIAGNOSIS — Z3A33 33 weeks gestation of pregnancy: Secondary | ICD-10-CM | POA: Diagnosis not present

## 2021-01-24 DIAGNOSIS — O1403 Mild to moderate pre-eclampsia, third trimester: Principal | ICD-10-CM | POA: Diagnosis present

## 2021-01-24 DIAGNOSIS — O99613 Diseases of the digestive system complicating pregnancy, third trimester: Secondary | ICD-10-CM | POA: Diagnosis present

## 2021-01-24 DIAGNOSIS — R030 Elevated blood-pressure reading, without diagnosis of hypertension: Secondary | ICD-10-CM | POA: Diagnosis present

## 2021-01-24 DIAGNOSIS — R519 Headache, unspecified: Secondary | ICD-10-CM | POA: Diagnosis present

## 2021-01-24 DIAGNOSIS — Z20822 Contact with and (suspected) exposure to covid-19: Secondary | ICD-10-CM | POA: Diagnosis present

## 2021-01-24 DIAGNOSIS — O30043 Twin pregnancy, dichorionic/diamniotic, third trimester: Secondary | ICD-10-CM

## 2021-01-24 DIAGNOSIS — O26893 Other specified pregnancy related conditions, third trimester: Secondary | ICD-10-CM | POA: Diagnosis present

## 2021-01-24 DIAGNOSIS — Z3A32 32 weeks gestation of pregnancy: Secondary | ICD-10-CM

## 2021-01-24 DIAGNOSIS — R0981 Nasal congestion: Secondary | ICD-10-CM | POA: Diagnosis present

## 2021-01-24 DIAGNOSIS — O1490 Unspecified pre-eclampsia, unspecified trimester: Secondary | ICD-10-CM | POA: Diagnosis present

## 2021-01-24 DIAGNOSIS — K219 Gastro-esophageal reflux disease without esophagitis: Secondary | ICD-10-CM | POA: Diagnosis present

## 2021-01-24 LAB — COMPREHENSIVE METABOLIC PANEL
ALT: 10 U/L (ref 0–44)
AST: 20 U/L (ref 15–41)
Albumin: 2.7 g/dL — ABNORMAL LOW (ref 3.5–5.0)
Alkaline Phosphatase: 97 U/L (ref 38–126)
Anion gap: 9 (ref 5–15)
BUN: 8 mg/dL (ref 6–20)
CO2: 21 mmol/L — ABNORMAL LOW (ref 22–32)
Calcium: 8.8 mg/dL — ABNORMAL LOW (ref 8.9–10.3)
Chloride: 106 mmol/L (ref 98–111)
Creatinine, Ser: 0.53 mg/dL (ref 0.44–1.00)
GFR, Estimated: >60 mL/min (ref >60)
Glucose, Bld: 105 mg/dL — ABNORMAL HIGH (ref 70–99)
Potassium: 3.7 mmol/L (ref 3.5–5.1)
Sodium: 136 mmol/L (ref 135–145)
Total Bilirubin: 0.8 mg/dL (ref 0.3–1.2)
Total Protein: 6 g/dL — ABNORMAL LOW (ref 6.5–8.1)

## 2021-01-24 LAB — URINALYSIS, ROUTINE W REFLEX MICROSCOPIC
Bilirubin Urine: NEGATIVE
Glucose, UA: NEGATIVE mg/dL
Hgb urine dipstick: NEGATIVE
Ketones, ur: NEGATIVE mg/dL
Leukocytes,Ua: NEGATIVE
Nitrite: NEGATIVE
Protein, ur: 100 mg/dL — AB
Specific Gravity, Urine: >1.03 — ABNORMAL HIGH (ref 1.005–1.030)
pH: 6 (ref 5.0–8.0)

## 2021-01-24 LAB — URINALYSIS, MICROSCOPIC (REFLEX): RBC / HPF: NONE SEEN RBC/hpf (ref 0–5)

## 2021-01-24 LAB — RESP PANEL BY RT-PCR (FLU A&B, COVID) ARPGX2
Influenza A by PCR: NEGATIVE
Influenza B by PCR: NEGATIVE
SARS Coronavirus 2 by RT PCR: NEGATIVE

## 2021-01-24 LAB — CBC
HCT: 32.3 % — ABNORMAL LOW (ref 36.0–46.0)
Hemoglobin: 10.9 g/dL — ABNORMAL LOW (ref 12.0–15.0)
MCH: 29.3 pg (ref 26.0–34.0)
MCHC: 33.7 g/dL (ref 30.0–36.0)
MCV: 86.8 fL (ref 80.0–100.0)
Platelets: 214 10*3/uL (ref 150–400)
RBC: 3.72 MIL/uL — ABNORMAL LOW (ref 3.87–5.11)
RDW: 15.2 % (ref 11.5–15.5)
WBC: 9.1 10*3/uL (ref 4.0–10.5)
nRBC: 0 % (ref 0.0–0.2)

## 2021-01-24 LAB — PROTEIN / CREATININE RATIO, URINE
Creatinine, Urine: 206.69 mg/dL
Protein Creatinine Ratio: 0.59 mg/mg{Cre} — ABNORMAL HIGH (ref 0.00–0.15)
Total Protein, Urine: 121 mg/dL

## 2021-01-24 LAB — TYPE AND SCREEN
ABO/RH(D): O POS
Antibody Screen: NEGATIVE

## 2021-01-24 MED ORDER — CALCIUM CARBONATE ANTACID 500 MG PO CHEW
2.0000 | CHEWABLE_TABLET | ORAL | Status: DC | PRN
  Administered 2021-01-25 – 2021-01-26 (×4): 400 mg via ORAL
  Filled 2021-01-24 (×5): qty 2

## 2021-01-24 MED ORDER — BETAMETHASONE SOD PHOS & ACET 6 (3-3) MG/ML IJ SUSP
12.0000 mg | INTRAMUSCULAR | Status: AC
  Administered 2021-01-24 – 2021-01-25 (×2): 12 mg via INTRAMUSCULAR
  Filled 2021-01-24: qty 5

## 2021-01-24 MED ORDER — DOCUSATE SODIUM 100 MG PO CAPS
100.0000 mg | ORAL_CAPSULE | Freq: Every day | ORAL | Status: DC
  Filled 2021-01-24 (×3): qty 1

## 2021-01-24 MED ORDER — ACETAMINOPHEN 500 MG PO TABS
1000.0000 mg | ORAL_TABLET | Freq: Once | ORAL | Status: AC
  Administered 2021-01-24: 1000 mg via ORAL
  Filled 2021-01-24: qty 2

## 2021-01-24 MED ORDER — SERTRALINE HCL 50 MG PO TABS
100.0000 mg | ORAL_TABLET | Freq: Every evening | ORAL | Status: DC | PRN
  Administered 2021-01-24 – 2021-01-26 (×3): 100 mg via ORAL
  Filled 2021-01-24 (×3): qty 2

## 2021-01-24 MED ORDER — ZOLPIDEM TARTRATE 5 MG PO TABS
5.0000 mg | ORAL_TABLET | Freq: Every evening | ORAL | Status: DC | PRN
  Administered 2021-01-24 – 2021-01-26 (×3): 5 mg via ORAL
  Filled 2021-01-24 (×3): qty 1

## 2021-01-24 MED ORDER — ACETAMINOPHEN 325 MG PO TABS
650.0000 mg | ORAL_TABLET | ORAL | Status: DC | PRN
  Administered 2021-01-24 – 2021-01-27 (×7): 650 mg via ORAL
  Filled 2021-01-24 (×7): qty 2

## 2021-01-24 MED ORDER — PRENATAL MULTIVITAMIN CH
1.0000 | ORAL_TABLET | Freq: Every day | ORAL | Status: DC
  Administered 2021-01-25 – 2021-01-26 (×2): 1 via ORAL
  Filled 2021-01-24 (×2): qty 1

## 2021-01-24 NOTE — MAU Provider Note (Signed)
History     CSN: [id number]  Arrival date and time: 01/24/21 1230   Event Date/Time   First Provider Initiated Contact with Patient 01/24/21 1329      Chief Complaint  Patient presents with  . BP Evaluation   HPI Anna Park is a 30 y.o. G3P1011 at [redacted]w[redacted]d who presents from the office for BP evaluation. Recent presentation of hypertension; had proteinuria in office yesterday with normal labs. Saw Dr. Lorane Gell today and was sent her for labs and possible admission. Since arrival to MAU, reports headache. Rates 6/10. Hasn't treated symptoms. Nothing makes better or worse. Denies visual disturbance or epigastric pain. No vaginal bleeding or LOF. Good fetal movement x 2.   OB History    Gravida  3   Para  1   Term  1   Preterm      AB  1   Living  1     SAB  1   IAB      Ectopic      Multiple  0   Live Births  1           Past Medical History:  Diagnosis Date  . Headache     Past Surgical History:  Procedure Laterality Date  . DILATION AND CURETTAGE OF UTERUS      Family History  Problem Relation Age of Onset  . Hypertension Mother     Social History   Tobacco Use  . Smoking status: Never Smoker  . Smokeless tobacco: Never Used  Vaping Use  . Vaping Use: Never used  Substance Use Topics  . Alcohol use: Yes    Comment: DRINK  OCC- NONE  DURING  PREG  . Drug use: No    Allergies:  Allergies  Allergen Reactions  . Sulfa Antibiotics Hives    Medications Prior to Admission  Medication Sig Dispense Refill Last Dose  . aspirin 81 MG chewable tablet Chew by mouth daily.   01/24/2021 at Unknown time  . cholecalciferol (VITAMIN D3) 25 MCG (1000 UNIT) tablet Take 1,000 Units by mouth daily.   01/24/2021 at Unknown time  . famotidine (PEPCID) 20 MG tablet Take 20 mg by mouth 2 (two) times daily.   01/24/2021 at Unknown time  . fexofenadine (ALLEGRA) 180 MG tablet Take 180 mg by mouth daily.   01/23/2021 at Unknown time  . folic acid (FOLVITE) 400  MCG tablet Take 400 mcg by mouth daily.   01/24/2021 at Unknown time  . Prenatal Vit-Fe Fumarate-FA (PRENATAL MULTIVITAMIN) TABS tablet Take 1 tablet by mouth daily at 12 noon.    01/24/2021 at Unknown time  . sertraline (ZOLOFT) 50 MG tablet Take 100 mg by mouth daily.   01/23/2021 at Unknown time  . vitamin C (ASCORBIC ACID) 500 MG tablet Take 500 mg by mouth daily.   01/24/2021 at Unknown time  . eletriptan (RELPAX) 40 MG tablet eletriptan 40 mg tablet (Patient not taking: Reported on 01/23/2021)     . ferrous sulfate 325 (65 FE) MG tablet iron 325 mg (65 mg iron) tablet  Take 1 tablet every day by oral route.       Review of Systems  Constitutional: Negative.   Eyes: Negative for photophobia and visual disturbance.  Gastrointestinal: Negative.   Genitourinary: Negative.   Neurological: Positive for headaches.   Physical Exam   Blood pressure (!) 151/90, pulse 96, temperature 97.9 F (36.6 C), temperature source Oral, resp. rate 20, height 5\' 5"  (1.651 m), weight 92  kg, SpO2 97 %, currently breastfeeding.  Physical Exam Vitals and nursing note reviewed.  Constitutional:      General: She is not in acute distress.    Appearance: Normal appearance.  HENT:     Head: Normocephalic and atraumatic.  Cardiovascular:     Rate and Rhythm: Normal rate and regular rhythm.     Heart sounds: Normal heart sounds.  Pulmonary:     Effort: Pulmonary effort is normal. No respiratory distress.     Breath sounds: Normal breath sounds.  Abdominal:     Tenderness: There is no abdominal tenderness.  Musculoskeletal:     Right lower leg: 2+ Pitting Edema present.     Left lower leg: 2+ Pitting Edema present.  Skin:    General: Skin is warm and dry.  Neurological:     Mental Status: She is alert.     Deep Tendon Reflexes: Reflexes normal.  Psychiatric:        Mood and Affect: Mood normal.        Behavior: Behavior normal.    Fetal Tracing: Baby A Baseline: 150 Variability:  moderate Accelerations: 15x15 Decelerations: none  Baby B Baseline: 135 Variability: moderate Accelerations:15x15 Decelerations: none  Toco: Q3-6 mins  MAU Course  Procedures Results for orders placed or performed during the hospital encounter of 01/24/21 (from the past 24 hour(s))  Urinalysis, Routine w reflex microscopic Urine, Clean Catch     Status: Abnormal   Collection Time: 01/24/21  1:08 PM  Result Value Ref Range   Color, Urine YELLOW YELLOW   APPearance HAZY (A) CLEAR   Specific Gravity, Urine >1.030 (H) 1.005 - 1.030   pH 6.0 5.0 - 8.0   Glucose, UA NEGATIVE NEGATIVE mg/dL   Hgb urine dipstick NEGATIVE NEGATIVE   Bilirubin Urine NEGATIVE NEGATIVE   Ketones, ur NEGATIVE NEGATIVE mg/dL   Protein, ur 254 (A) NEGATIVE mg/dL   Nitrite NEGATIVE NEGATIVE   Leukocytes,Ua NEGATIVE NEGATIVE  Protein / creatinine ratio, urine     Status: Abnormal   Collection Time: 01/24/21  1:08 PM  Result Value Ref Range   Creatinine, Urine 206.69 mg/dL   Total Protein, Urine 121 mg/dL   Protein Creatinine Ratio 0.59 (H) 0.00 - 0.15 mg/mg[Cre]  Urinalysis, Microscopic (reflex)     Status: Abnormal   Collection Time: 01/24/21  1:08 PM  Result Value Ref Range   RBC / HPF NONE SEEN 0 - 5 RBC/hpf   WBC, UA 0-5 0 - 5 WBC/hpf   Bacteria, UA FEW (A) NONE SEEN   Squamous Epithelial / LPF 0-5 0 - 5   Mucus PRESENT   CBC     Status: Abnormal   Collection Time: 01/24/21  1:38 PM  Result Value Ref Range   WBC 9.1 4.0 - 10.5 K/uL   RBC 3.72 (L) 3.87 - 5.11 MIL/uL   Hemoglobin 10.9 (L) 12.0 - 15.0 g/dL   HCT 27.0 (L) 62.3 - 76.2 %   MCV 86.8 80.0 - 100.0 fL   MCH 29.3 26.0 - 34.0 pg   MCHC 33.7 30.0 - 36.0 g/dL   RDW 83.1 51.7 - 61.6 %   Platelets 214 150 - 400 K/uL   nRBC 0.0 0.0 - 0.2 %  Comprehensive metabolic panel     Status: Abnormal   Collection Time: 01/24/21  1:38 PM  Result Value Ref Range   Sodium 136 135 - 145 mmol/L   Potassium 3.7 3.5 - 5.1 mmol/L   Chloride 106 98 - 111  mmol/L   CO2 21 (L) 22 - 32 mmol/L   Glucose, Bld 105 (H) 70 - 99 mg/dL   BUN 8 6 - 20 mg/dL   Creatinine, Ser 5.40 0.44 - 1.00 mg/dL   Calcium 8.8 (L) 8.9 - 10.3 mg/dL   Total Protein 6.0 (L) 6.5 - 8.1 g/dL   Albumin 2.7 (L) 3.5 - 5.0 g/dL   AST 20 15 - 41 U/L   ALT 10 0 - 44 U/L   Alkaline Phosphatase 97 38 - 126 U/L   Total Bilirubin 0.8 0.3 - 1.2 mg/dL   GFR, Estimated >98 >11 mL/min   Anion gap 9 5 - 15    MDM Elevated BPs; none in severe range. Headache resolved with tylenol. Labs normal. Elevated protein creatinine ratio giving patient diagnosis of preeclampsia  EKG normal   Dr. Lorane Gell to admit patient for preeclampsia; will obs & give BMZ  Assessment and Plan   1. Pre-eclampsia in third trimester   2. Dichorionic diamniotic twin pregnancy in third trimester   3. [redacted] weeks gestation of pregnancy    Admit per Dr. Verna Czech 01/24/2021, 1:29 PM

## 2021-01-24 NOTE — MAU Note (Signed)
Sent from MD office for BP evalution.  Reports was seen in office for BP check, BP 135/98.  States was seen yesterday @ appt BP 140/98, +proteinuria, and labs drawn (normal lab values per today).  Denies current H/A and visual disturbances, endorses epigastric pain.  Endorses +FM.

## 2021-01-24 NOTE — H&P (Signed)
Antepartum History and Physical   Anna Park is a 29 y.o. female G3P1011 that presented from office for elevated blood pressure. She is admitted for new diagnosis for preeclampsia without severe features. She was seen in the office today for follow up BP check.  BP has never been in severe range. She reports previous mild headache and now resolved. She denies current vision changes but reports slight blurriness previously. Overall she feels "off" and fatigued. She has reported pain in R rib but believes this is due to twin B position/movement.   Pregnancy course is notable for Di-Di pregnancy   OB History    Gravida  3   Para  1   Term  1   Preterm      AB  1   Living  1     SAB  1   IAB      Ectopic      Multiple  0   Live Births  1          Past Medical History:  Diagnosis Date  . Headache    Past Surgical History:  Procedure Laterality Date  . DILATION AND CURETTAGE OF UTERUS     Family History: family history includes Hypertension in her mother. Social History:  reports that she has never smoked. She has never used smokeless tobacco. She reports current alcohol use. She reports that she does not use drugs.     Maternal Diabetes: No Genetic Screening: Normal Maternal Ultrasounds/Referrals: Normal Fetal Ultrasounds or other Referrals:  None Maternal Substance Abuse:  No Significant Maternal Medications:  None Significant Maternal Lab Results:  None Other Comments:  DI-Di twins  Review of Systems History   Blood pressure (!) 140/91, pulse 90, temperature 97.9 F (36.6 C), temperature source Oral, resp. rate 20, height 5\' 5"  (1.651 m), weight 92 kg, SpO2 98 %, currently breastfeeding. Exam Physical Exam  Gen: alert, no distress Chest: nonlabored breathing Abdomen: gravid, nontender Ext: no signs of DVT, 2+ bilateral edema No hyperreflexia  Prenatal labs: ABO, Rh:   Antibody:   Rubella:   RPR:    HBsAg:    HIV:    GBS:      Most  recent in office 01/16/21: Twin A- [redacted]w[redacted]d**Vertex**normal fluid**EFW= 2006g (4'7oz)= 66 %tile**BPP: 8/8**Twin B- [redacted]w[redacted]d**Transverse**normal fluid**EFW= 2071g (4'9oz)= 73 %tile**BPP: 8/8  Assessment/Plan: . Admit to [facility] for preeclampsia without severe features . TID monitoring.  Twins both reactive and reassuring on admission. [facility] BMZ given<34 weeks. 3/18, 3/19 . Diet: Regular . DVT Ppx: SCDs . EKG wnl . Discussed management of preeclampsia. Will monitor closely for severe features, which may indicate Tx with magnesium gtt and delivery. o Otherwise may be a candidate for outpatient mgmt . All questions answered.   4/19 01/24/2021, 3:36 PM

## 2021-01-24 NOTE — MAU Note (Signed)
Called Lab to check on the status of the Protein/Creatinine Ratio. Lab stated they would run the sample now.

## 2021-01-25 LAB — COMPREHENSIVE METABOLIC PANEL
ALT: 8 U/L (ref 0–44)
AST: 20 U/L (ref 15–41)
Albumin: 2.5 g/dL — ABNORMAL LOW (ref 3.5–5.0)
Alkaline Phosphatase: 88 U/L (ref 38–126)
Anion gap: 10 (ref 5–15)
BUN: 11 mg/dL (ref 6–20)
CO2: 21 mmol/L — ABNORMAL LOW (ref 22–32)
Calcium: 8.8 mg/dL — ABNORMAL LOW (ref 8.9–10.3)
Chloride: 103 mmol/L (ref 98–111)
Creatinine, Ser: 0.58 mg/dL (ref 0.44–1.00)
GFR, Estimated: >60 mL/min (ref >60)
Glucose, Bld: 111 mg/dL — ABNORMAL HIGH (ref 70–99)
Potassium: 3.4 mmol/L — ABNORMAL LOW (ref 3.5–5.1)
Sodium: 134 mmol/L — ABNORMAL LOW (ref 135–145)
Total Bilirubin: 0.4 mg/dL (ref 0.3–1.2)
Total Protein: 5.5 g/dL — ABNORMAL LOW (ref 6.5–8.1)

## 2021-01-25 LAB — LACTATE DEHYDROGENASE: LDH: 138 U/L (ref 98–192)

## 2021-01-25 LAB — CBC
HCT: 30.2 % — ABNORMAL LOW (ref 36.0–46.0)
Hemoglobin: 10 g/dL — ABNORMAL LOW (ref 12.0–15.0)
MCH: 28.9 pg (ref 26.0–34.0)
MCHC: 33.1 g/dL (ref 30.0–36.0)
MCV: 87.3 fL (ref 80.0–100.0)
Platelets: 217 10*3/uL (ref 150–400)
RBC: 3.46 MIL/uL — ABNORMAL LOW (ref 3.87–5.11)
RDW: 15.3 % (ref 11.5–15.5)
WBC: 9.2 10*3/uL (ref 4.0–10.5)
nRBC: 0 % (ref 0.0–0.2)

## 2021-01-25 LAB — URIC ACID: Uric Acid, Serum: 5.7 mg/dL (ref 2.5–7.1)

## 2021-01-25 LAB — RPR: RPR Ser Ql: NONREACTIVE

## 2021-01-25 MED ORDER — SALINE SPRAY 0.65 % NA SOLN
2.0000 | NASAL | Status: DC | PRN
  Administered 2021-01-25 (×2): 2 via NASAL
  Filled 2021-01-25: qty 44

## 2021-01-25 MED ORDER — LORATADINE 10 MG PO TABS
10.0000 mg | ORAL_TABLET | Freq: Every day | ORAL | Status: DC
  Administered 2021-01-25 – 2021-01-27 (×3): 10 mg via ORAL
  Filled 2021-01-25 (×3): qty 1

## 2021-01-25 MED ORDER — LACTATED RINGERS IV BOLUS
500.0000 mL | Freq: Once | INTRAVENOUS | Status: AC
  Administered 2021-01-25: 500 mL via INTRAVENOUS

## 2021-01-25 NOTE — Progress Notes (Signed)
Antepartum Progress Note   Anna Park is a 30 y.o. female G3P1011 at [redacted]w[redacted]d that presented for preeclampsia without severe features, Di-Di Twin pregnancy. Patient reports primary complaint of sinus congestion. She states this has been present throughout pregnancy and she has treated with allegra and nasal afrin. We discussed avoiding chronic afrin use in pregnancy, particularly with BP elevation.  She denies headache but did have slight headache behind her eyes yesterday. Resolved with tylenol. She had chest heaviness in MAU (EKG normal) and at night when anxiety was increasing.  Home dose of sertraline was helpful and she was able to rest.  No new RUQ or epigastric pain.      Blood pressure 136/80, pulse 99, temperature 97.8 F (36.6 C), temperature source Oral, resp. rate 20, height 5\' 5"  (1.651 m), weight 92 kg, SpO2 100 %, currently breastfeeding. Exam Physical Exam  Gen: Chest: alert, no distress CV: nonlabored breaths Abdomen: gravid, nontender Ext: no hyperreflexia FHT   - A: 125/moderate/positive accels/no decels  - B: 140/moderate/positive accels/no decels  Assessment/Plan: . Admitted to [facility] for preeclampsia without severe features, Di-Di twin pregnancy . BP normotensive and mild range elevated.  P/C ratio 0.59 . TID monitoring, reactive and reassuring since admission . Sinus congestion. Discussed avoiding continued Afrin. Will trial cetirizine and nasal saline spray . BMZ  3/18, 3/19 . Diet: regular . DVT Ppx: SCDs   4/19 01/25/2021, 6:50 AM

## 2021-01-25 NOTE — Progress Notes (Addendum)
Pt with low UO during the day at around 200 ml in last 7 hours.  C/o feeling lightheaded.  Not really able to eat much but is drinking fluids.  Also c/o persistent headache and intermittent sharp RUQ pain.  VSS.  Phoned Dr. Lorane Gell with above information.  See new orders for IVF bolus and repeat labs.

## 2021-01-25 NOTE — Progress Notes (Signed)
At bedside to check on patient.   Patient continues to report ongoing sinus congestion, minimally but somewhat improved with claritin and nasal saline.  She does report mild headache responsive to tylenol behind her eyes and forehead.  She does report RUQ pain which has been ongoing, may be related to fetus movement and position.  Vitals with BMI 01/25/2021 01/25/2021 01/25/2021  Height - - -  Weight - - -  BMI - - -  Systolic 132 139 197  Diastolic 78 89 80  Pulse 100 99 99   Gen: alert, no distress Chest: nonlabored breaths CV: 1-2+ bilateral edema  A/P: Di-Di twins, admitted for preeclampsia.  - Suspect headache related to congestion.  Continue tylenol for relief.  - Blood pressure has been signifcantly improved today  - Patient has not had much appetite or PO intake. There is question of low UO and color is amber. Will provide 500 cc bolus and repeat labs  - CMP not concerning for HELLP yesterday, will repeat labs, particularly in setting of possible decreased UO.  - Will continue to follow UO and provide 500 cc bolus.  Extensive discussion with patient. Will continue to watch for onset of severe features. Continue current management and prioritize BMZ time. Nilda Simmer MD

## 2021-01-26 MED ORDER — METOCLOPRAMIDE HCL 5 MG/ML IJ SOLN
10.0000 mg | Freq: Once | INTRAMUSCULAR | Status: AC
  Administered 2021-01-26: 10 mg via INTRAVENOUS
  Filled 2021-01-26: qty 2

## 2021-01-26 MED ORDER — DIPHENHYDRAMINE HCL 50 MG/ML IJ SOLN
12.5000 mg | Freq: Once | INTRAMUSCULAR | Status: AC
  Administered 2021-01-26: 12.5 mg via INTRAVENOUS
  Filled 2021-01-26: qty 1

## 2021-01-26 MED ORDER — LACTATED RINGERS IV BOLUS
500.0000 mL | Freq: Once | INTRAVENOUS | Status: AC
  Administered 2021-01-26: 500 mL via INTRAVENOUS

## 2021-01-26 NOTE — Progress Notes (Signed)
Antepartum Progress Note   Anna Park is a 30 y.o. female G3P1011 at [redacted]w[redacted]d that presented for preeclampsia without severe features, Di-Di Twin pregnancy.  Her overall condition has improved since yesterday.  Headache is intermittent, mild, with sinus pressure.  Is is currently absent.  She rested well and felt better after 500 cc bolus. Denies vision changes, ctx, LOF, VB. She reports good FM x 2.    Blood pressure 122/72, pulse 93, temperature 98.4 F (36.9 C), temperature source Oral, resp. rate 16, height 5\' 5"  (1.651 m), weight 92 kg, SpO2 100 %, currently breastfeeding. Exam Physical Exam  Gen: Chest: alert, no distress CV: nonlabored breaths Abdomen: gravid, nontender Ext: no hyperreflexia FHT   - A: 145/moderate/positive accels/no decels  - B: 125/moderate/positive accels/no decels  Assessment/Plan: . Admitted to [facility] for preeclampsia without severe features, Di-Di twin pregnancy . BP improved since admission, yesterday all normotensive range . Repeat HELLP labs stable, improved urine output . TID monitoring, reactive and reassuring since admission . Sinus congestion. Discussed avoiding continued Afrin. Continue cetirizine and nasal saline spray . S/p BMZ  3/18, 3/19 . Diet: regular . DVT Ppx: SCDs   4/19 01/26/2021, 7:20 AM

## 2021-01-27 ENCOUNTER — Inpatient Hospital Stay (HOSPITAL_BASED_OUTPATIENT_CLINIC_OR_DEPARTMENT_OTHER): Payer: BC Managed Care – PPO

## 2021-01-27 ENCOUNTER — Inpatient Hospital Stay (HOSPITAL_COMMUNITY): Payer: BC Managed Care – PPO

## 2021-01-27 DIAGNOSIS — Z3A34 34 weeks gestation of pregnancy: Secondary | ICD-10-CM | POA: Diagnosis not present

## 2021-01-27 DIAGNOSIS — O1493 Unspecified pre-eclampsia, third trimester: Secondary | ICD-10-CM

## 2021-01-27 DIAGNOSIS — O30043 Twin pregnancy, dichorionic/diamniotic, third trimester: Secondary | ICD-10-CM

## 2021-01-27 LAB — URIC ACID: Uric Acid, Serum: 5.9 mg/dL (ref 2.5–7.1)

## 2021-01-27 LAB — COMPREHENSIVE METABOLIC PANEL
ALT: 10 U/L (ref 0–44)
AST: 22 U/L (ref 15–41)
Albumin: 2.3 g/dL — ABNORMAL LOW (ref 3.5–5.0)
Alkaline Phosphatase: 81 U/L (ref 38–126)
Anion gap: 9 (ref 5–15)
BUN: 11 mg/dL (ref 6–20)
CO2: 22 mmol/L (ref 22–32)
Calcium: 8.3 mg/dL — ABNORMAL LOW (ref 8.9–10.3)
Chloride: 104 mmol/L (ref 98–111)
Creatinine, Ser: 0.58 mg/dL (ref 0.44–1.00)
GFR, Estimated: >60 mL/min (ref >60)
Glucose, Bld: 90 mg/dL (ref 70–99)
Potassium: 3.7 mmol/L (ref 3.5–5.1)
Sodium: 135 mmol/L (ref 135–145)
Total Bilirubin: 0.7 mg/dL (ref 0.3–1.2)
Total Protein: 5.1 g/dL — ABNORMAL LOW (ref 6.5–8.1)

## 2021-01-27 LAB — CBC
HCT: 28.3 % — ABNORMAL LOW (ref 36.0–46.0)
Hemoglobin: 9.2 g/dL — ABNORMAL LOW (ref 12.0–15.0)
MCH: 28.9 pg (ref 26.0–34.0)
MCHC: 32.5 g/dL (ref 30.0–36.0)
MCV: 89 fL (ref 80.0–100.0)
Platelets: 181 10*3/uL (ref 150–400)
RBC: 3.18 MIL/uL — ABNORMAL LOW (ref 3.87–5.11)
RDW: 15.3 % (ref 11.5–15.5)
WBC: 9.3 10*3/uL (ref 4.0–10.5)
nRBC: 0.2 % (ref 0.0–0.2)

## 2021-01-27 LAB — LACTATE DEHYDROGENASE: LDH: 113 U/L (ref 98–192)

## 2021-01-27 MED ORDER — METOCLOPRAMIDE HCL 5 MG/ML IJ SOLN
10.0000 mg | Freq: Once | INTRAMUSCULAR | Status: AC
  Administered 2021-01-27: 10 mg via INTRAVENOUS
  Filled 2021-01-27: qty 2

## 2021-01-27 MED ORDER — OMEPRAZOLE MAGNESIUM 20 MG PO TBEC: 20.0000 mg | DELAYED_RELEASE_TABLET | Freq: Every day | ORAL | 1 refills | Status: AC

## 2021-01-27 MED ORDER — DIPHENHYDRAMINE HCL 50 MG/ML IJ SOLN
12.5000 mg | Freq: Once | INTRAMUSCULAR | Status: AC
  Administered 2021-01-27: 12.5 mg via INTRAVENOUS
  Filled 2021-01-27: qty 1

## 2021-01-27 MED ORDER — CYCLOBENZAPRINE HCL 10 MG PO TABS
5.0000 mg | ORAL_TABLET | Freq: Three times a day (TID) | ORAL | Status: DC | PRN
  Administered 2021-01-27: 5 mg via ORAL
  Filled 2021-01-27: qty 1

## 2021-01-27 MED ORDER — CYCLOBENZAPRINE HCL 5 MG PO TABS: 5.0000 mg | ORAL_TABLET | Freq: Three times a day (TID) | ORAL | 0 refills | Status: AC | PRN

## 2021-01-27 MED ORDER — CYCLOBENZAPRINE HCL 5 MG PO TABS: 5.0000 mg | ORAL_TABLET | Freq: Three times a day (TID) | ORAL | 0 refills | Status: DC | PRN

## 2021-01-27 NOTE — Progress Notes (Addendum)
   01/27/21 1947  Provider Notification  Provider Name/Title Dr. Elon Spanner  Date Provider Notified 01/27/21  Time Provider Notified 1947  Method of Notification Phone  Pt c/o of intermittent lower left rib pain underneath her breast radiating to her back. Pt's and FOB are concerned it maybe a cardiac issue.   57 - Dr. Elon Spanner called and spoke with pt and reassured pt, its heartburn and prescription sent in for her.

## 2021-01-27 NOTE — Discharge Summary (Addendum)
Physician Discharge Summary  Patient ID: Anna Park MRN: [id number] DOB/AGE: [birth year] 29 y.o.  Admit date: 01/24/2021 Discharge date: 01/27/2021  Admission Diagnoses: mild pre-eclampsia  Discharge Diagnoses:  Active Problems:   Preeclampsia   Discharged Condition: good  Hospital Course: 30 yo G3P1011 presented originally with elevated blood pressure and diagnosed with PIH w/out severe features. She never had severe range Bps and PIH labs were reassuring. She had no severe features by symptoms either. Originally had a HA and sinus pressure but this improved. She received 2 doses of BMZ and had reassuring fetal testing. An MFM [place] was done which showed normal fluid, active babies x 2, and normal placentas. She will be discharged with flexeril prn for her headaches in addition to omeprazole for her presumed reflux.   Consults: None  Significant Diagnostic Studies: labs:  CBC    Component Value Date/Time   WBC 9.3 01/27/2021 0512   RBC 3.18 (L) 01/27/2021 0512   HGB 9.2 (L) 01/27/2021 0512   HCT 28.3 (L) 01/27/2021 0512   PLT 181 01/27/2021 0512   MCV 89.0 01/27/2021 0512   MCH 28.9 01/27/2021 0512   MCHC 32.5 01/27/2021 0512   RDW 15.3 01/27/2021 0512   LYMPHSABS 2.5 04/03/2016 0528   MONOABS 0.7 04/03/2016 0528   EOSABS 0.0 04/03/2016 0528   BASOSABS 0.0 04/03/2016 0528   CMP     Component Value Date/Time   NA 135 01/27/2021 0512   K 3.7 01/27/2021 0512   CL 104 01/27/2021 0512   CO2 22 01/27/2021 0512   GLUCOSE 90 01/27/2021 0512   BUN 11 01/27/2021 0512   CREATININE 0.58 01/27/2021 0512   CALCIUM 8.3 (L) 01/27/2021 0512   PROT 5.1 (L) 01/27/2021 0512   ALBUMIN 2.3 (L) 01/27/2021 0512   AST 22 01/27/2021 0512   ALT 10 01/27/2021 0512   ALKPHOS 81 01/27/2021 0512   BILITOT 0.7 01/27/2021 0512   GFRNONAA >60 01/27/2021 0512   GFRAA >60 03/23/2016 1406     Treatments: CEFM and BMZ  Discharge Exam: Blood pressure 120/74, pulse 79, temperature 97.9 F  (36.6 C), temperature source Oral, resp. rate 17, height 5\' 5"  (1.651 m), weight 92 kg, SpO2 97 %, currently breastfeeding. General appearance: alert and cooperative Resp: NWOB Chest wall: no tenderness GI: soft, non-tender; bowel sounds normal; no masses,  no organomegaly and gravid uterus Extremities: extremities normal, atraumatic, no cyanosis or edema and edema trace Pulses: 2+ and symmetric  Disposition:  There are no questions and answers to display.         Allergies as of 01/27/2021      Reactions   Sulfa Antibiotics Hives      Medication List    STOP taking these medications   Emergen-C Vitamin C Lite Pack   guaiFENesin 600 MG 12 hr tablet Commonly known as: MUCINEX   oxymetazoline 0.05 % nasal spray Commonly known as: AFRIN   vitamin C 500 MG tablet Commonly known as: ASCORBIC ACID     TAKE these medications   aspirin 81 MG chewable tablet Chew 81 mg by mouth daily.   cholecalciferol 25 MCG (1000 UNIT) tablet Commonly known as: VITAMIN D3 Take 1,000 Units by mouth daily.   cyclobenzaprine 5 MG tablet Commonly known as: FLEXERIL Take 1 tablet (5 mg total) by mouth 3 (three) times daily as needed for muscle spasms.   cyclobenzaprine 5 MG tablet Commonly known as: FLEXERIL Take 1 tablet (5 mg total) by mouth 3 (three) times daily as  needed for muscle spasms.   famotidine 20 MG tablet Commonly known as: PEPCID Take 20 mg by mouth 2 (two) times daily.   ferrous sulfate 325 (65 FE) MG tablet Take 325 mg by mouth at bedtime.   fexofenadine 180 MG tablet Commonly known as: ALLEGRA Take 180 mg by mouth at bedtime.   folic acid 400 MCG tablet Commonly known as: FOLVITE Take 400 mcg by mouth daily.   omeprazole 20 MG tablet Commonly known as: PriLOSEC OTC Take 1 tablet (20 mg total) by mouth daily.   prenatal multivitamin Tabs tablet Take 1 tablet by mouth daily at 12 noon.   sertraline 100 MG tablet Commonly known as: ZOLOFT Take 100 mg by  mouth at bedtime.   zinc gluconate 50 MG tablet Take 50 mg by mouth daily.        Signed: Ranae Pila 01/27/2021, 11:05 AM

## 2021-01-27 NOTE — Progress Notes (Addendum)
Antepartum Progress Note   Anna Park is a 30 y.o. female G3P1011 at [redacted]w[redacted]d that presented for preeclampsia without severe features, Di-Di Twin pregnancy.  Denies vision changes, ctx, LOF, VB. She reports good FM x 2. HA resolved.     Blood pressure 120/74, pulse 79, temperature 97.9 F (36.6 C), temperature source Oral, resp. rate 17, height 5\' 5"  (1.651 m), weight 92 kg, SpO2 97 %, currently breastfeeding. Exam Physical Exam  Gen: Chest: alert, no distress CV: nonlabored breaths Abdomen: gravid, nontender Ext: no hyperreflexia FHT   - A: 145/moderate/positive accels/no decels  - B: 125/moderate/positive accels/no decels  Assessment/Plan: . Admitted to [facility] for preeclampsia without severe features, Di-Di twin pregnancy . BP improved since admission, remains normotensive . Repeat HELLP labs stable, good urine output . TID monitoring, reactive and reassuring since admission. Growth [facility] today . Sinus congestion. Discussed avoiding continued Afrin. Continue cetirizine and nasal saline spray . S/p BMZ  3/18, 3/19 . Diet: regular DVT Ppx: SCDs   D/c later today as long as 4/19 reassuring.  Precautions reviewed with patient in detail.  Will have outpatient management of PIH w/out severe features. CS has been moved up to 37 wga/ Questions answered.    [place] Arshdeep Bolger 01/27/2021, 11:09 AM

## 2021-01-27 NOTE — Progress Notes (Signed)
Epigastric/left chest pain present since admission on and off. Currently just a dull ache but wanted to know what this could be. Has a history of gastric reflux and is on tums. Her vitals are normal and is without overt chest pain/dyspnea. Will initiate omeprazole. Precautions given. She and her husband are very reliable.

## 2021-01-29 ENCOUNTER — Encounter: Payer: Self-pay | Admitting: General Practice

## 2021-01-30 ENCOUNTER — Inpatient Hospital Stay (HOSPITAL_COMMUNITY): Payer: BC Managed Care – PPO

## 2021-01-30 ENCOUNTER — Other Ambulatory Visit: Payer: Self-pay

## 2021-01-30 ENCOUNTER — Ambulatory Visit (INDEPENDENT_AMBULATORY_CARE_PROVIDER_SITE_OTHER): Payer: BC Managed Care – PPO | Admitting: *Deleted

## 2021-01-30 ENCOUNTER — Encounter (HOSPITAL_COMMUNITY): Payer: Self-pay | Admitting: Obstetrics and Gynecology

## 2021-01-30 ENCOUNTER — Inpatient Hospital Stay (HOSPITAL_COMMUNITY)
Admission: AD | Admit: 2021-01-30 | Discharge: 2021-02-03 | DRG: 785 | Disposition: A | Discharge status: Home / Self Care | Payer: BC Managed Care – PPO | Attending: Obstetrics and Gynecology | Admitting: Obstetrics and Gynecology

## 2021-01-30 VITALS — BP 135/92 | HR 103

## 2021-01-30 DIAGNOSIS — O9962 Diseases of the digestive system complicating childbirth: Secondary | ICD-10-CM | POA: Diagnosis present

## 2021-01-30 DIAGNOSIS — R0602 Shortness of breath: Secondary | ICD-10-CM

## 2021-01-30 DIAGNOSIS — Z3689 Encounter for other specified antenatal screening: Secondary | ICD-10-CM

## 2021-01-30 DIAGNOSIS — Z302 Encounter for sterilization: Secondary | ICD-10-CM

## 2021-01-30 DIAGNOSIS — O328XX2 Maternal care for other malpresentation of fetus, fetus 2: Secondary | ICD-10-CM | POA: Diagnosis present

## 2021-01-30 DIAGNOSIS — K219 Gastro-esophageal reflux disease without esophagitis: Secondary | ICD-10-CM | POA: Diagnosis present

## 2021-01-30 DIAGNOSIS — O30043 Twin pregnancy, dichorionic/diamniotic, third trimester: Secondary | ICD-10-CM

## 2021-01-30 DIAGNOSIS — O1493 Unspecified pre-eclampsia, third trimester: Secondary | ICD-10-CM | POA: Diagnosis not present

## 2021-01-30 DIAGNOSIS — O1404 Mild to moderate pre-eclampsia, complicating childbirth: Secondary | ICD-10-CM | POA: Diagnosis present

## 2021-01-30 DIAGNOSIS — Z20822 Contact with and (suspected) exposure to covid-19: Secondary | ICD-10-CM | POA: Diagnosis present

## 2021-01-30 DIAGNOSIS — O30009 Twin pregnancy, unspecified number of placenta and unspecified number of amniotic sacs, unspecified trimester: Secondary | ICD-10-CM | POA: Diagnosis present

## 2021-01-30 DIAGNOSIS — R030 Elevated blood-pressure reading, without diagnosis of hypertension: Secondary | ICD-10-CM | POA: Diagnosis present

## 2021-01-30 DIAGNOSIS — Z3A34 34 weeks gestation of pregnancy: Secondary | ICD-10-CM | POA: Diagnosis not present

## 2021-01-30 LAB — TYPE AND SCREEN
ABO/RH(D): O POS
Antibody Screen: NEGATIVE

## 2021-01-30 LAB — URINALYSIS, ROUTINE W REFLEX MICROSCOPIC
Bilirubin Urine: NEGATIVE
Glucose, UA: NEGATIVE mg/dL
Hgb urine dipstick: NEGATIVE
Ketones, ur: 5 mg/dL — AB
Leukocytes,Ua: NEGATIVE
Nitrite: NEGATIVE
Protein, ur: 100 mg/dL — AB
Specific Gravity, Urine: 1.026 (ref 1.005–1.030)
pH: 5 (ref 5.0–8.0)

## 2021-01-30 LAB — COMPREHENSIVE METABOLIC PANEL
ALT: 12 U/L (ref 0–44)
AST: 19 U/L (ref 15–41)
Albumin: 2.6 g/dL — ABNORMAL LOW (ref 3.5–5.0)
Alkaline Phosphatase: 97 U/L (ref 38–126)
Anion gap: 9 (ref 5–15)
BUN: 10 mg/dL (ref 6–20)
CO2: 22 mmol/L (ref 22–32)
Calcium: 8.4 mg/dL — ABNORMAL LOW (ref 8.9–10.3)
Chloride: 105 mmol/L (ref 98–111)
Creatinine, Ser: 0.47 mg/dL (ref 0.44–1.00)
GFR, Estimated: >60 mL/min (ref >60)
Glucose, Bld: 99 mg/dL (ref 70–99)
Potassium: 3.5 mmol/L (ref 3.5–5.1)
Sodium: 136 mmol/L (ref 135–145)
Total Bilirubin: 0.4 mg/dL (ref 0.3–1.2)
Total Protein: 5.7 g/dL — ABNORMAL LOW (ref 6.5–8.1)

## 2021-01-30 LAB — CBC
HCT: 33 % — ABNORMAL LOW (ref 36.0–46.0)
Hemoglobin: 10.6 g/dL — ABNORMAL LOW (ref 12.0–15.0)
MCH: 28.6 pg (ref 26.0–34.0)
MCHC: 32.1 g/dL (ref 30.0–36.0)
MCV: 88.9 fL (ref 80.0–100.0)
Platelets: 222 10*3/uL (ref 150–400)
RBC: 3.71 MIL/uL — ABNORMAL LOW (ref 3.87–5.11)
RDW: 15.1 % (ref 11.5–15.5)
WBC: 9.2 10*3/uL (ref 4.0–10.5)
nRBC: 0 % (ref 0.0–0.2)

## 2021-01-30 LAB — PROTEIN / CREATININE RATIO, URINE
Creatinine, Urine: 188.77 mg/dL
Protein Creatinine Ratio: 0.9 mg/mg{Cre} — ABNORMAL HIGH (ref 0.00–0.15)
Total Protein, Urine: 170 mg/dL

## 2021-01-30 LAB — RESP PANEL BY RT-PCR (FLU A&B, COVID) ARPGX2
Influenza A by PCR: NEGATIVE
Influenza B by PCR: NEGATIVE
SARS Coronavirus 2 by RT PCR: NEGATIVE

## 2021-01-30 MED ORDER — LABETALOL HCL 5 MG/ML IV SOLN: 40.0000 mg | INTRAVENOUS | Status: DC | PRN

## 2021-01-30 MED ORDER — HYDRALAZINE HCL 20 MG/ML IJ SOLN: 10.0000 mg | INTRAMUSCULAR | Status: DC | PRN

## 2021-01-30 MED ORDER — CALCIUM CARBONATE ANTACID 500 MG PO CHEW: 2.0000 | CHEWABLE_TABLET | ORAL | Status: DC | PRN

## 2021-01-30 MED ORDER — LORATADINE 10 MG PO TABS
10.0000 mg | ORAL_TABLET | Freq: Every day | ORAL | Status: DC
  Administered 2021-01-31: 10 mg via ORAL
  Filled 2021-01-30: qty 1

## 2021-01-30 MED ORDER — SODIUM CHLORIDE 0.9 % IV SOLN: 250.0000 mL | INTRAVENOUS | Status: DC | PRN

## 2021-01-30 MED ORDER — SODIUM CHLORIDE 0.9% FLUSH
3.0000 mL | Freq: Two times a day (BID) | INTRAVENOUS | Status: DC
  Administered 2021-01-31: 3 mL via INTRAVENOUS

## 2021-01-30 MED ORDER — LABETALOL HCL 5 MG/ML IV SOLN: 80.0000 mg | INTRAVENOUS | Status: DC | PRN

## 2021-01-30 MED ORDER — LABETALOL HCL 5 MG/ML IV SOLN: 20.0000 mg | INTRAVENOUS | Status: DC | PRN

## 2021-01-30 MED ORDER — SODIUM CHLORIDE 0.9% FLUSH: 3.0000 mL | INTRAVENOUS | Status: DC | PRN

## 2021-01-30 MED ORDER — ZOLPIDEM TARTRATE 5 MG PO TABS: 5.0000 mg | ORAL_TABLET | Freq: Every evening | ORAL | Status: DC | PRN

## 2021-01-30 MED ORDER — SERTRALINE HCL 50 MG PO TABS
100.0000 mg | ORAL_TABLET | Freq: Every day | ORAL | Status: DC
  Administered 2021-01-30: 100 mg via ORAL
  Filled 2021-01-30: qty 2

## 2021-01-30 MED ORDER — ACETAMINOPHEN 325 MG PO TABS
650.0000 mg | ORAL_TABLET | ORAL | Status: DC | PRN
  Administered 2021-01-30 – 2021-01-31 (×3): 650 mg via ORAL
  Filled 2021-01-30 (×3): qty 2

## 2021-01-30 MED ORDER — FAMOTIDINE 20 MG PO TABS
20.0000 mg | ORAL_TABLET | Freq: Two times a day (BID) | ORAL | Status: DC
  Administered 2021-01-30 – 2021-01-31 (×2): 20 mg via ORAL
  Filled 2021-01-30 (×2): qty 1

## 2021-01-30 MED ORDER — PRENATAL MULTIVITAMIN CH: 1.0000 | ORAL_TABLET | Freq: Every day | ORAL | Status: DC

## 2021-01-30 MED ORDER — DOCUSATE SODIUM 100 MG PO CAPS
100.0000 mg | ORAL_CAPSULE | Freq: Every day | ORAL | Status: DC
  Administered 2021-01-31: 100 mg via ORAL
  Filled 2021-01-30: qty 1

## 2021-01-30 NOTE — H&P (Signed)
30 year old G 3 P 1011 at 45 w 5 days with twin gestation presented from office where BP elevated and patient reports increase in chest pressure.  History of vaginal delivery x 1  Recently admitted and diagnosed with preeclampsia  Past Medical History:  Diagnosis Date  . Allergy   . Anxiety   . GERD (gastroesophageal reflux disease)   . Headache   . Migraines   . Mouth ulcers   . Ovarian cyst    Past Surgical History:  Procedure Laterality Date  . DILATION AND CURETTAGE OF UTERUS     Prior to Admission medications   Medication Sig Start Date End Date Taking? Authorizing Provider  aspirin 81 MG chewable tablet Chew 81 mg by mouth daily.    [provider]  cholecalciferol (VITAMIN D3) 25 MCG (1000 UNIT) tablet Take 1,000 Units by mouth daily.    [provider]  cyclobenzaprine (FLEXERIL) 5 MG tablet Take 1 tablet (5 mg total) by mouth 3 (three) times daily as needed for muscle spasms. 01/27/21   Ranae Pila, MD  famotidine (PEPCID) 20 MG tablet Take 20 mg by mouth 2 (two) times daily.    [provider]  ferrous sulfate 325 (65 FE) MG tablet Take 325 mg by mouth at bedtime.    [provider]  fexofenadine (ALLEGRA) 180 MG tablet Take 180 mg by mouth at bedtime.    [provider]  folic acid (FOLVITE) 400 MCG tablet Take 400 mcg by mouth daily.    [provider]  omeprazole (PRILOSEC OTC) 20 MG tablet Take 1 tablet (20 mg total) by mouth daily. Patient not taking: Reported on 01/30/2021 01/27/21   Ranae Pila, MD  Prenatal Vit-Fe Fumarate-FA (PRENATAL MULTIVITAMIN) TABS tablet Take 1 tablet by mouth daily at 12 noon.     [provider]  sertraline (ZOLOFT) 100 MG tablet Take 100 mg by mouth at bedtime.    [provider]  vitamin C (ASCORBIC ACID) 500 MG tablet Take 500 mg by mouth daily.    [provider]  zinc gluconate 50 MG tablet Take 50 mg by mouth daily.    [provider]   BP (!) 145/92 (BP Location: Left Arm)   Pulse (!) 103   Temp 98.3 F (36.8 C) (Oral)   Resp 18   SpO2 98% Comment: room air General alert and oriented Notable 7 pound weight gain in past week  Labs pending  IMPRESSION: Twin IUP at 34 w 5 days Preeclampsia  PLAN: Preeclampsia labs - depending on those results would consider admission/observeration versus delivery  Patient for C Section and BTL

## 2021-01-30 NOTE — MAU Provider Note (Signed)
History     CSN: [id number]  Arrival date and time: 01/30/21 1559   Event Date/Time   First Provider Initiated Contact with Patient 01/30/21 1702      Chief Complaint  Patient presents with  . Hypertension   [name] is a 30 y.o. G3P1011 at [redacted]w[redacted]d who presents to MAU for preeclampsia evaluation after she was seen today for an NST and her BP was found to be mildly elevated. When she went for her regular OB appt, her BP was 140/90, but she has gained 7-8lbs in the past week, which was a concern to her provider, as well as the swelling that she is having. Patient reports she is having swelling in lower legs, feet and hands. Patient reports she is having trouble walking because her "legs feel heavy." Patient reports she is still able to walk, but she is a little shaky. Pt also reports right now she has a headache across the front of her head, which she rates as 5/10. Patient also reports chest heaviness and shortness of breath, but is able to talk to provider without needing to pause for air. Patient reports she also feels lightheaded and clammy, hot, flushed. Patient reports she has not taken anything for her HA today.  Patient reports last time she ate/drank was 205PM - filet o'fish sandwich and fries, Dr. Reino Kent.  Pt denies blurry vision/seeing spots, N/V, epigastric pain, swelling in face.  Pt denies constipation, diarrhea, or urinary problems. Pt denies fever, chills, fatigue or changes in appetite. Pt denies dizziness, weakness.  Pt denies VB, ctx, LOF and reports good FM.  Current pregnancy problems? Di-di, elevated BP, proteinuria Blood Type? O Positive Allergies? sulfa Current medications? PNV, folic acid, VitC, VitD, low dose ASA, zinc, Pepcid, Flexeril (last took yesterday), Zoloft, Allegra, iron  Dr. Vincente Poli called MAU provider prior to MAU provider seeing patient to confirm that patient would be admitted regardless of lab results, but that lab results would help to  determine location for admission.   OB History    Gravida  3   Para  1   Term  1   Preterm  0   AB  1   Living  1     SAB  1   IAB  0   Ectopic  0   Multiple  0   Live Births  1           Past Medical History:  Diagnosis Date  . Allergy   . Anxiety   . GERD (gastroesophageal reflux disease)   . Headache   . Migraines   . Mouth ulcers   . Ovarian cyst     Past Surgical History:  Procedure Laterality Date  . DILATION AND CURETTAGE OF UTERUS      Family History  Problem Relation Age of Onset  . Hypertension Mother   . Migraines Mother   . Asthma Mother   . Hypertension Father   . Mental illness Brother   . Heart disease Maternal Grandfather   . Hypertension Maternal Grandfather   . COPD Maternal Grandfather   . Dementia Paternal Grandmother   . Heart disease Paternal Grandfather   . Hypertension Paternal Grandfather   . Migraines Maternal Aunt   . Migraines Maternal Uncle   . Diabetes Maternal Uncle     Social History   Tobacco Use  . Smoking status: Never Smoker  . Smokeless tobacco: Never Used  Vaping Use  . Vaping Use: Never used  Substance  Use Topics  . Alcohol use: Yes    Comment: DRINK  OCC- NONE  DURING  PREG  . Drug use: No    Allergies:  Allergies  Allergen Reactions  . Sulfa Antibiotics Hives and Rash  . Sulfa Antibiotics Hives    Medications Prior to Admission  Medication Sig Dispense Refill Last Dose  . aspirin 81 MG chewable tablet Chew 81 mg by mouth daily.   01/30/2021 at Unknown time  . cholecalciferol (VITAMIN D3) 25 MCG (1000 UNIT) tablet Take 1,000 Units by mouth daily.   01/30/2021 at Unknown time  . famotidine (PEPCID) 20 MG tablet Take 20 mg by mouth 2 (two) times daily.   01/30/2021 at Unknown time  . ferrous sulfate 325 (65 FE) MG tablet Take 325 mg by mouth at bedtime.   01/29/2021 at Unknown time  . fexofenadine (ALLEGRA) 180 MG tablet Take 180 mg by mouth at bedtime.   01/29/2021 at Unknown time  . folic  acid (FOLVITE) 400 MCG tablet Take 400 mcg by mouth daily.   01/30/2021 at Unknown time  . Prenatal Vit-Fe Fumarate-FA (PRENATAL MULTIVITAMIN) TABS tablet Take 1 tablet by mouth daily at 12 noon.    01/30/2021 at Unknown time  . sertraline (ZOLOFT) 100 MG tablet Take 100 mg by mouth at bedtime.   01/29/2021 at Unknown time  . vitamin C (ASCORBIC ACID) 500 MG tablet Take 500 mg by mouth daily.   01/30/2021 at Unknown time  . zinc gluconate 50 MG tablet Take 50 mg by mouth daily.   01/30/2021 at Unknown time  . cyclobenzaprine (FLEXERIL) 5 MG tablet Take 1 tablet (5 mg total) by mouth 3 (three) times daily as needed for muscle spasms. 30 tablet 0   . omeprazole (PRILOSEC OTC) 20 MG tablet Take 1 tablet (20 mg total) by mouth daily. (Patient not taking: Reported on 01/30/2021) 60 tablet 1     Review of Systems  Constitutional: Positive for diaphoresis. Negative for chills, fatigue and fever.  Eyes: Negative for visual disturbance.  Respiratory: Positive for shortness of breath.   Cardiovascular: Positive for chest pain.  Gastrointestinal: Negative for abdominal pain, constipation, diarrhea, nausea and vomiting.  Genitourinary: Negative for dysuria, flank pain, frequency, pelvic pain, urgency, vaginal bleeding and vaginal discharge.  Musculoskeletal: Positive for gait problem (pt reporting heavy and shaky feeling in legs when walking).       Swelling of hands and lower extremities.  Neurological: Positive for light-headedness and headaches. Negative for dizziness and weakness.   Physical Exam   Blood pressure (!) 137/92, pulse (!) 101, temperature 98.3 F (36.8 C), temperature source Oral, resp. rate 18, SpO2 95 %, currently breastfeeding.  Patient Vitals for the past 24 hrs:  BP Temp Temp src Pulse Resp SpO2  01/30/21 1816 (!) 137/92 -- -- (!) 101 18 95 %  01/30/21 1801 (!) 140/97 -- -- 98 18 97 %  01/30/21 1746 (!) 141/95 -- -- (!) 108 18 96 %  01/30/21 1731 (!) 142/103 -- -- (!) 101 18 97 %   01/30/21 1701 (!) 147/95 -- -- (!) 105 18 96 %  01/30/21 1646 (!) 138/98 -- -- 95 18 97 %  01/30/21 1612 (!) 145/92 98.3 F (36.8 C) Oral (!) 103 18 98 %   Physical Exam Vitals and nursing note reviewed.  Constitutional:      General: She is not in acute distress.    Appearance: Normal appearance. She is not ill-appearing, toxic-appearing or diaphoretic.  HENT:  Head: Normocephalic and atraumatic.  Pulmonary:     Effort: Pulmonary effort is normal.  Musculoskeletal:     Right lower leg: Swelling present.     Left lower leg: Swelling present.     Right foot: Swelling present.     Left foot: Swelling present.  Neurological:     Mental Status: She is alert and oriented to person, place, and time.  Psychiatric:        Mood and Affect: Mood normal.        Behavior: Behavior normal.        Thought Content: Thought content normal.        Judgment: Judgment normal.    Results for orders placed or performed during the hospital encounter of 01/30/21 (from the past 24 hour(s))  Urinalysis, Routine w reflex microscopic Urine, Clean Catch     Status: Abnormal   Collection Time: 01/30/21  4:13 PM  Result Value Ref Range   Color, Urine AMBER (A) YELLOW   APPearance HAZY (A) CLEAR   Specific Gravity, Urine 1.026 1.005 - 1.030   pH 5.0 5.0 - 8.0   Glucose, UA NEGATIVE NEGATIVE mg/dL   Hgb urine dipstick NEGATIVE NEGATIVE   Bilirubin Urine NEGATIVE NEGATIVE   Ketones, ur 5 (A) NEGATIVE mg/dL   Protein, ur 209 (A) NEGATIVE mg/dL   Nitrite NEGATIVE NEGATIVE   Leukocytes,Ua NEGATIVE NEGATIVE   RBC / HPF 0-5 0 - 5 RBC/hpf   WBC, UA 0-5 0 - 5 WBC/hpf   Bacteria, UA FEW (A) NONE SEEN   Squamous Epithelial / LPF 6-10 0 - 5   Mucus PRESENT   CBC     Status: Abnormal   Collection Time: 01/30/21  5:05 PM  Result Value Ref Range   WBC 9.2 4.0 - 10.5 K/uL   RBC 3.71 (L) 3.87 - 5.11 MIL/uL   Hemoglobin 10.6 (L) 12.0 - 15.0 g/dL   HCT 47.0 (L) 96.2 - 83.6 %   MCV 88.9 80.0 - 100.0 fL    MCH 28.6 26.0 - 34.0 pg   MCHC 32.1 30.0 - 36.0 g/dL   RDW 62.9 47.6 - 54.6 %   Platelets 222 150 - 400 K/uL   nRBC 0.0 0.0 - 0.2 %  Comprehensive metabolic panel     Status: Abnormal   Collection Time: 01/30/21  5:05 PM  Result Value Ref Range   Sodium 136 135 - 145 mmol/L   Potassium 3.5 3.5 - 5.1 mmol/L   Chloride 105 98 - 111 mmol/L   CO2 22 22 - 32 mmol/L   Glucose, Bld 99 70 - 99 mg/dL   BUN 10 6 - 20 mg/dL   Creatinine, Ser 5.03 0.44 - 1.00 mg/dL   Calcium 8.4 (L) 8.9 - 10.3 mg/dL   Total Protein 5.7 (L) 6.5 - 8.1 g/dL   Albumin 2.6 (L) 3.5 - 5.0 g/dL   AST 19 15 - 41 U/L   ALT 12 0 - 44 U/L   Alkaline Phosphatase 97 38 - 126 U/L   Total Bilirubin 0.4 0.3 - 1.2 mg/dL   GFR, Estimated >54 >65 mL/min   Anion gap 9 5 - 15  Protein / creatinine ratio, urine     Status: Abnormal   Collection Time: 01/30/21  5:05 PM  Result Value Ref Range   Creatinine, Urine 188.77 mg/dL   Total Protein, Urine 170 mg/dL   Protein Creatinine Ratio 0.90 (H) 0.00 - 0.15 mg/mg[Cre]  Resp Panel by RT-PCR (Flu A&B, Covid)  Nasopharyngeal Swab     Status: None   Collection Time: 01/30/21  5:25 PM   Specimen: Nasopharyngeal Swab; Nasopharyngeal(NP) swabs in vial transport medium  Result Value Ref Range   SARS Coronavirus 2 by RT PCR NEGATIVE NEGATIVE   Influenza A by PCR NEGATIVE NEGATIVE   Influenza B by PCR NEGATIVE NEGATIVE   DG CHEST PORT 1 VIEW  Result Date: 01/30/2021 CLINICAL DATA:  Shortness of breath EXAM: PORTABLE CHEST 1 VIEW COMPARISON:  10/16/2020 FINDINGS: Heart and mediastinal contours are within normal limits. No focal opacities or effusions. No acute bony abnormality. IMPRESSION: Negative Electronically Signed   By: Charlett Nose M.D.   On: 01/30/2021 17:56   [place] MFM OB LIMITED  Result Date: 01/27/2021 ----------------------------------------------------------------------  OBSTETRICS REPORT                        (Signed Final 01/27/2021 10:17 pm)  ---------------------------------------------------------------------- Patient Info  ID #:       [id number]                          D.O.B.:  Oct 12, 1991 (29 yrs)  Name:       Anna Park                Visit Date: 01/27/2021 07:30 pm ---------------------------------------------------------------------- Performed By  Attending:        Lin Landsman      Ref. Address:      [address]                    [place]                                                              [address]                                                              [place]                                                              [id number]  Performed By:     Marcellina Millin          Secondary Phy.:    [facility] MAU/Triage                    RDMS  Referred By:      Lucita Lora            [facility] ---------------------------------------------------------------------- Orders  #  Description                           Code        Ordered By  1  US MFM OB LIMITED                     [id number]    ELISE LEGER ----------------------------------------------------------------------  #  Order #                     Accession #                Episode #  1  [id number]                   [id number]                 [id number] ---------------------------------------------------------------------- Indications  Pre-eclampsia                                   O14.90  [redacted] weeks gestation of pregnancy                 Z3A.6534  Twin pregnancy, di/di, third trimester          O30.043 ---------------------------------------------------------------------- Fetal Evaluation (Fetus A)  Num Of Fetuses:          2  Fetal Heart Rate(bpm):   153  Cardiac Activity:        Observed  Fetal Lie:               Maternal right side  Presentation:            Cephalic  Placenta:                Anterior  Membrane Desc:      Dividing Membrane seen - Dichorionic.  Amniotic Fluid  AFI FV:       Within normal limits                              Largest Pocket(cm)                              4.13 ---------------------------------------------------------------------- OB History  Gravidity:    3         Term:   1        Prem:   0        SAB:   1  TOP:          0       Ectopic:  0        Living: 1 ---------------------------------------------------------------------- Gestational Age (Fetus A)  Clinical EDD:  34w 2d                                        EDD:   03/08/21  Best:          34w 2d     Det. By:  Clinical EDD             EDD:   03/08/21 ---------------------------------------------------------------------- Anatomy (Fetus A)  Stomach:  Appears normal, left   Bladder:                Appears normal                         sided ---------------------------------------------------------------------- Fetal Evaluation (Fetus B)  Num Of Fetuses:          2  Fetal Heart Rate(bpm):   157  Cardiac Activity:        Observed  Fetal Lie:               Upper Fetus  Presentation:            Transverse, head to maternal left  Placenta:                Anterior  Membrane Desc:      Dividing Membrane seen - Dichorionic.  Amniotic Fluid  AFI FV:      Within normal limits                              Largest Pocket(cm)                              5.47 ---------------------------------------------------------------------- Gestational Age (Fetus B)  Clinical EDD:  34w 2d                                        EDD:   03/08/21  Best:          34w 2d     Det. By:  Clinical EDD             EDD:   03/08/21 ---------------------------------------------------------------------- Anatomy (Fetus B)  Stomach:               Appears normal, left   Spine:                  Appears normal                         sided ---------------------------------------------------------------------- Cervix Uterus Adnexa  Cervix  Not visualized (advanced GA >24wks)  Uterus  No abnormality visualized.  Right Ovary  Within normal limits.   Left Ovary  Not visualized. No adnexal mass visualized.  Adnexa  No abnormality visualized. ---------------------------------------------------------------------- Impression  Diamniotic dichorionic twin pregnancy.hospitalized with  preeclampsia.  Good fetal movement and amniotic fluid in both twins.  Cephalic/Transverse presentation. ---------------------------------------------------------------------- Recommendations  Management per inpatient provider. ----------------------------------------------------------------------               Lin Landsman, MD Electronically Signed Final Report   01/27/2021 10:17 pm ----------------------------------------------------------------------  MAU Course  Procedures  MDM -preeclampsia evaluation without severe range BP in MAU on admission -symptoms include: HA (mild per pt report), chest pain/SOB, lightheadedness -UA: amber/hazy/5ketones/100PRO/few bacteria -CBC: H/H 10.6/33.0, platelets 222 -CMP: serum creatinine 0.47, AST/ALT 19/12 -PCr: pending at time of admission -EFM (Twin A): reactive with initial tachycardia       -baseline: 170/155       -variability: moderate       -accels: present, 15x15       -decels: +variables       -TOCO: irritability -EFM (Twin B): reactive       -baseline: 135       -  variability: moderate       -accels: present, 15x15       -decels: absent       -TOCO: irritability -called Dr. Vincente Poli to notify of results and initial fetal tachycardia, per Dr. Vincente Poli will admit to [facility]  Orders Placed This Encounter  Procedures  . Resp Panel by RT-PCR (Flu A&B, Covid) Nasopharyngeal Swab    Standing Status:   Standing    Number of Occurrences:   1    Order Specific Question:   Is this test for diagnosis or screening    Answer:   Screening    Order Specific Question:   Symptomatic for COVID-19 as defined by CDC    Answer:   No    Order Specific Question:   Hospitalized for COVID-19    Answer:   No    Order  Specific Question:   Admitted to ICU for COVID-19    Answer:   No    Order Specific Question:   Previously tested for COVID-19    Answer:   Yes    Order Specific Question:   Resident in a congregate (group) care setting    Answer:   No    Order Specific Question:   Employed in healthcare setting    Answer:   No    Order Specific Question:   Pregnant    Answer:   Yes    Order Specific Question:   Has patient completed COVID vaccination(s) (2 doses of Pfizer/Moderna 1 dose of [employer])    Answer:   Yes    Order Specific Question:   Has patient completed COVID Booster / 3rd dose    Answer:   No  . DG CHEST PORT 1 VIEW    Standing Status:   Standing    Number of Occurrences:   1    Order Specific Question:   Symptom/Reason for Exam    Answer:   Shortness of breath [786.05.ICD-9-CM]  . Urinalysis, Routine w reflex microscopic Urine, Clean Catch    Standing Status:   Standing    Number of Occurrences:   1  . CBC    Standing Status:   Standing    Number of Occurrences:   1  . Comprehensive metabolic panel    Standing Status:   Standing    Number of Occurrences:   1  . Protein / creatinine ratio, urine    Standing Status:   Standing    Number of Occurrences:   1  . CBC on admission    Standing Status:   Standing    Number of Occurrences:   1  . Diet regular Room service appropriate? Yes; Fluid consistency: Thin    Standing Status:   Standing    Number of Occurrences:   1    Order Specific Question:   Room service appropriate?    Answer:   Yes    Order Specific Question:   Fluid consistency:    Answer:   Thin  . Notify physician (specify)    Standing Status:   Standing    Number of Occurrences:   20    Order Specific Question:   Notify Physician    Answer:   for pulse less than 60 or greater than 120    Order Specific Question:   Notify Physician    Answer:   for respiratory rate less than 12 or greater than 28    Order Specific Question:   Notify Physician  Answer:    for temperature greater than 100.4    Order Specific Question:   Notify Physician    Answer:   for urinary output less than 30 ml/hr    Order Specific Question:   Notify Physician    Answer:   for systolic BP less than 80 or greater than 140    Order Specific Question:   Notify Physician    Answer:   for diastolic BP less than 40 or greater than 90  . Vital signs    While awake, respect sleep.    Standing Status:   Standing    Number of Occurrences:   1  . Daily weights    Standing Status:   Standing    Number of Occurrences:   1  . Strict intake and output    Standing Status:   Standing    Number of Occurrences:   1  . Defer vaginal exam for vaginal bleeding or PROM <37 weeks    Standing Status:   Standing    Number of Occurrences:   1  . Initiate Oral Care Protocol    Standing Status:   Standing    Number of Occurrences:   1  . Initiate Carrier Fluid Protocol    Standing Status:   Standing    Number of Occurrences:   1  . Bed rest with bathroom privileges    Standing Status:   Standing    Number of Occurrences:   1  . Full code    Standing Status:   Standing    Number of Occurrences:   1  . Airborne and Contact precautions    Standing Status:   Standing    Number of Occurrences:   1  . Pulse oximetry with vital signs    Standing Status:   Standing    Number of Occurrences:   1  . EKG 12-Lead    Standing Status:   Standing    Number of Occurrences:   1  . Type and screen [facility]    [facility]     Standing Status:   Standing    Number of Occurrences:   1  . Fetal nonstress test    Standing Status:   Standing    Number of Occurrences:   18  . Admit to Inpatient (patient's expected length of stay will be greater than 2 midnights or inpatient only procedure)    Standing Status:   Standing    Number of Occurrences:   1    Order Specific Question:   Hospital Area    Answer:   [facility] [[id number]]    Order  Specific Question:   Level of Care    Answer:   Antepartum [20]    Order Specific Question:   Covid Evaluation    Answer:   Confirmed COVID Negative    Order Specific Question:   Diagnosis    Answer:   Pregnancy, twins [409811]    Order Specific Question:   Admitting Physician    Answer:   Marcelle Overlie [[id number]]    Order Specific Question:   Attending Physician    Answer:   Marcelle Overlie [[id number]]    Order Specific Question:   Estimated length of stay    Answer:   past midnight tomorrow    Order Specific Question:   Certification:    Answer:   I certify this patient will need inpatient services for at least  2 midnights   Meds ordered this encounter  Medications  . acetaminophen (TYLENOL) tablet 650 mg  . zolpidem (AMBIEN) tablet 5 mg  . docusate sodium (COLACE) capsule 100 mg  . calcium carbonate (TUMS - dosed in mg elemental calcium) chewable tablet 400 mg of elemental calcium  . prenatal multivitamin tablet 1 tablet  . sodium chloride flush (NS) 0.9 % injection 3 mL  . 0.9 %  sodium chloride infusion   Assessment and Plan   1. Pre-eclampsia in third trimester   2. Shortness of breath   3. Dichorionic diamniotic twin pregnancy in third trimester   4. [redacted] weeks gestation of pregnancy   5. NST (non-stress test) reactive    -admit to [facility]  Odie Sera Erique Kaser 01/30/2021, 7:40 PM

## 2021-01-30 NOTE — MAU Note (Signed)
Anna Park is a 30 y.o. at [redacted]w[redacted]d here in MAU reporting: was sent over from the office. Previously dx with pre-e. Currently has a headache but no visual changes. Having intermittent RUQ pain. Pt states she just feels very off. +FM  Onset of complaint: ongoing but worse  Pain score: 4/10  Vitals:   01/30/21 1612  BP: (!) 145/92  Pulse: (!) 103  Resp: 18  Temp: 98.3 F (36.8 C)  SpO2: 98%     FHT: +FM  Lab orders placed from triage: UA

## 2021-01-30 NOTE — Progress Notes (Signed)
Labs are normal NST of both twin a and b are category 1  Results for orders placed or performed during the hospital encounter of 01/30/21 (from the past 24 hour(s))  Urinalysis, Routine w reflex microscopic Urine, Clean Catch     Status: Abnormal   Collection Time: 01/30/21  4:13 PM  Result Value Ref Range   Color, Urine AMBER (A) YELLOW   APPearance HAZY (A) CLEAR   Specific Gravity, Urine 1.026 1.005 - 1.030   pH 5.0 5.0 - 8.0   Glucose, UA NEGATIVE NEGATIVE mg/dL   Hgb urine dipstick NEGATIVE NEGATIVE   Bilirubin Urine NEGATIVE NEGATIVE   Ketones, ur 5 (A) NEGATIVE mg/dL   Protein, ur 676 (A) NEGATIVE mg/dL   Nitrite NEGATIVE NEGATIVE   Leukocytes,Ua NEGATIVE NEGATIVE   RBC / HPF 0-5 0 - 5 RBC/hpf   WBC, UA 0-5 0 - 5 WBC/hpf   Bacteria, UA FEW (A) NONE SEEN   Squamous Epithelial / LPF 6-10 0 - 5   Mucus PRESENT   CBC     Status: Abnormal   Collection Time: 01/30/21  5:05 PM  Result Value Ref Range   WBC 9.2 4.0 - 10.5 K/uL   RBC 3.71 (L) 3.87 - 5.11 MIL/uL   Hemoglobin 10.6 (L) 12.0 - 15.0 g/dL   HCT 19.5 (L) 09.3 - 26.7 %   MCV 88.9 80.0 - 100.0 fL   MCH 28.6 26.0 - 34.0 pg   MCHC 32.1 30.0 - 36.0 g/dL   RDW 12.4 58.0 - 99.8 %   Platelets 222 150 - 400 K/uL   nRBC 0.0 0.0 - 0.2 %  Comprehensive metabolic panel     Status: Abnormal   Collection Time: 01/30/21  5:05 PM  Result Value Ref Range   Sodium 136 135 - 145 mmol/L   Potassium 3.5 3.5 - 5.1 mmol/L   Chloride 105 98 - 111 mmol/L   CO2 22 22 - 32 mmol/L   Glucose, Bld 99 70 - 99 mg/dL   BUN 10 6 - 20 mg/dL   Creatinine, Ser 3.38 0.44 - 1.00 mg/dL   Calcium 8.4 (L) 8.9 - 10.3 mg/dL   Total Protein 5.7 (L) 6.5 - 8.1 g/dL   Albumin 2.6 (L) 3.5 - 5.0 g/dL   AST 19 15 - 41 U/L   ALT 12 0 - 44 U/L   Alkaline Phosphatase 97 38 - 126 U/L   Total Bilirubin 0.4 0.3 - 1.2 mg/dL   GFR, Estimated >25 >05 mL/min   Anion gap 9 5 - 15  Resp Panel by RT-PCR (Flu A&B, Covid) Nasopharyngeal Swab     Status: None    Collection Time: 01/30/21  5:25 PM   Specimen: Nasopharyngeal Swab; Nasopharyngeal(NP) swabs in vial transport medium  Result Value Ref Range   SARS Coronavirus 2 by RT PCR NEGATIVE NEGATIVE   Influenza A by PCR NEGATIVE NEGATIVE   Influenza B by PCR NEGATIVE NEGATIVE   BP (!) 137/92   Pulse (!) 101   Temp 98.3 F (36.8 C) (Oral)   Resp 18   SpO2 95%  Discussed with patient  Will admit to ob high risk ob  nst bid Monitor for worsening Blood pressures  Consider delivery prior to 37 weeks if BPs are uncontrolled or labs worsen or fetal status of either baby is questionable

## 2021-01-30 NOTE — Progress Notes (Signed)
Pt reports having H/A currently @ pain scale 3/4.  She also has felt dizzy and light headed today as well as some intermittent RUQ pain. She denies blurry vision or seeing spots. Pt has visit scheduled w/Dr. Henderson Cloud @ office today. Copy of NST report and tracing sent to Dr. Henderson Cloud w/pt today.

## 2021-01-31 ENCOUNTER — Encounter (HOSPITAL_COMMUNITY)
Admission: AD | Disposition: A | Discharge status: Home / Self Care | Payer: Self-pay | Source: Home / Self Care | Attending: Obstetrics and Gynecology

## 2021-01-31 ENCOUNTER — Encounter (HOSPITAL_COMMUNITY): Payer: Self-pay | Admitting: Obstetrics and Gynecology

## 2021-01-31 ENCOUNTER — Other Ambulatory Visit: Payer: Self-pay | Admitting: Obstetrics and Gynecology

## 2021-01-31 ENCOUNTER — Inpatient Hospital Stay (HOSPITAL_COMMUNITY): Payer: BC Managed Care – PPO | Admitting: Anesthesiology

## 2021-01-31 ENCOUNTER — Inpatient Hospital Stay (HOSPITAL_COMMUNITY): Admit: 2021-01-31 | Payer: BC Managed Care – PPO | Admitting: Obstetrics and Gynecology

## 2021-01-31 LAB — COMPREHENSIVE METABOLIC PANEL
ALT: 10 U/L (ref 0–44)
AST: 20 U/L (ref 15–41)
Albumin: 2.4 g/dL — ABNORMAL LOW (ref 3.5–5.0)
Alkaline Phosphatase: 90 U/L (ref 38–126)
Anion gap: 8 (ref 5–15)
BUN: 9 mg/dL (ref 6–20)
CO2: 20 mmol/L — ABNORMAL LOW (ref 22–32)
Calcium: 8.8 mg/dL — ABNORMAL LOW (ref 8.9–10.3)
Chloride: 106 mmol/L (ref 98–111)
Creatinine, Ser: 0.54 mg/dL (ref 0.44–1.00)
GFR, Estimated: >60 mL/min (ref >60)
Glucose, Bld: 101 mg/dL — ABNORMAL HIGH (ref 70–99)
Potassium: 3.8 mmol/L (ref 3.5–5.1)
Sodium: 134 mmol/L — ABNORMAL LOW (ref 135–145)
Total Bilirubin: 0.4 mg/dL (ref 0.3–1.2)
Total Protein: 5.3 g/dL — ABNORMAL LOW (ref 6.5–8.1)

## 2021-01-31 LAB — CBC
HCT: 30.8 % — ABNORMAL LOW (ref 36.0–46.0)
HCT: 30.8 % — ABNORMAL LOW (ref 36.0–46.0)
Hemoglobin: 9.7 g/dL — ABNORMAL LOW (ref 12.0–15.0)
Hemoglobin: 9.8 g/dL — ABNORMAL LOW (ref 12.0–15.0)
MCH: 28 pg (ref 26.0–34.0)
MCH: 28.2 pg (ref 26.0–34.0)
MCHC: 31.5 g/dL (ref 30.0–36.0)
MCHC: 31.8 g/dL (ref 30.0–36.0)
MCV: 88.8 fL (ref 80.0–100.0)
MCV: 88.5 fL (ref 80.0–100.0)
Platelets: 202 10*3/uL (ref 150–400)
Platelets: 191 10*3/uL (ref 150–400)
RBC: 3.47 MIL/uL — ABNORMAL LOW (ref 3.87–5.11)
RBC: 3.48 MIL/uL — ABNORMAL LOW (ref 3.87–5.11)
RDW: 15.2 % (ref 11.5–15.5)
RDW: 15.2 % (ref 11.5–15.5)
WBC: 8.8 10*3/uL (ref 4.0–10.5)
WBC: 8 10*3/uL (ref 4.0–10.5)
nRBC: 0 % (ref 0.0–0.2)
nRBC: 0 % (ref 0.0–0.2)

## 2021-01-31 SURGERY — Surgical Case
Anesthesia: Spinal | Laterality: Bilateral

## 2021-01-31 MED ORDER — OXYTOCIN-SODIUM CHLORIDE 30-0.9 UT/500ML-% IV SOLN
INTRAVENOUS | Status: AC
  Filled 2021-01-31: qty 500

## 2021-01-31 MED ORDER — PHENYLEPHRINE HCL-NACL 20-0.9 MG/250ML-% IV SOLN
INTRAVENOUS | Status: AC
  Filled 2021-01-31: qty 250

## 2021-01-31 MED ORDER — KETOROLAC TROMETHAMINE 30 MG/ML IJ SOLN: 30.0000 mg | Freq: Four times a day (QID) | INTRAMUSCULAR | Status: AC | PRN

## 2021-01-31 MED ORDER — COCONUT OIL OIL: 1.0000 "application " | TOPICAL_OIL | Status: DC | PRN

## 2021-01-31 MED ORDER — LACTATED RINGERS IV BOLUS
250.0000 mL | Freq: Once | INTRAVENOUS | Status: AC
  Administered 2021-01-31: 250 mL via INTRAVENOUS

## 2021-01-31 MED ORDER — CEFAZOLIN SODIUM-DEXTROSE 2-4 GM/100ML-% IV SOLN
2.0000 g | INTRAVENOUS | Status: AC
  Administered 2021-01-31: 2 g via INTRAVENOUS

## 2021-01-31 MED ORDER — DIPHENHYDRAMINE HCL 25 MG PO CAPS: 25.0000 mg | ORAL_CAPSULE | Freq: Four times a day (QID) | ORAL | Status: DC | PRN

## 2021-01-31 MED ORDER — DEXTROSE 5 % IV SOLN
1.0000 ug/kg/h | INTRAVENOUS | Status: DC | PRN
  Filled 2021-01-31: qty 5

## 2021-01-31 MED ORDER — OXYTOCIN-SODIUM CHLORIDE 30-0.9 UT/500ML-% IV SOLN: 2.5000 [IU]/h | INTRAVENOUS | Status: AC

## 2021-01-31 MED ORDER — PHENYLEPHRINE 40 MCG/ML (10ML) SYRINGE FOR IV PUSH (FOR BLOOD PRESSURE SUPPORT)
PREFILLED_SYRINGE | INTRAVENOUS | Status: DC | PRN
  Administered 2021-01-31: 120 ug via INTRAVENOUS
  Administered 2021-01-31: 80 ug via INTRAVENOUS

## 2021-01-31 MED ORDER — MAGNESIUM SULFATE 40 GM/1000ML IV SOLN
INTRAVENOUS | Status: AC
  Filled 2021-01-31: qty 1000

## 2021-01-31 MED ORDER — BUPIVACAINE IN DEXTROSE 0.75-8.25 % IT SOLN
INTRATHECAL | Status: DC | PRN
  Administered 2021-01-31: 1.6 mL via INTRATHECAL

## 2021-01-31 MED ORDER — ONDANSETRON HCL 4 MG/2ML IJ SOLN: 4.0000 mg | Freq: Three times a day (TID) | INTRAMUSCULAR | Status: DC | PRN

## 2021-01-31 MED ORDER — SIMETHICONE 80 MG PO CHEW
80.0000 mg | CHEWABLE_TABLET | Freq: Three times a day (TID) | ORAL | Status: DC
  Administered 2021-01-31 – 2021-02-03 (×7): 80 mg via ORAL
  Filled 2021-01-31 (×7): qty 1

## 2021-01-31 MED ORDER — NALBUPHINE HCL 10 MG/ML IJ SOLN: 5.0000 mg | Freq: Once | INTRAMUSCULAR | Status: DC | PRN

## 2021-01-31 MED ORDER — WITCH HAZEL-GLYCERIN EX PADS: 1.0000 "application " | MEDICATED_PAD | CUTANEOUS | Status: DC | PRN

## 2021-01-31 MED ORDER — FENTANYL CITRATE (PF) 100 MCG/2ML IJ SOLN
INTRAMUSCULAR | Status: DC | PRN
  Administered 2021-01-31 (×2): 50 ug via INTRAVENOUS

## 2021-01-31 MED ORDER — SENNOSIDES-DOCUSATE SODIUM 8.6-50 MG PO TABS
2.0000 | ORAL_TABLET | Freq: Every day | ORAL | Status: DC
  Administered 2021-02-01 – 2021-02-03 (×3): 2 via ORAL
  Filled 2021-01-31 (×3): qty 2

## 2021-01-31 MED ORDER — MAGNESIUM SULFATE BOLUS VIA INFUSION
4.0000 g | Freq: Once | INTRAVENOUS | Status: AC
  Administered 2021-01-31: 4 g via INTRAVENOUS
  Filled 2021-01-31: qty 1000

## 2021-01-31 MED ORDER — NALBUPHINE HCL 10 MG/ML IJ SOLN: 5.0000 mg | INTRAMUSCULAR | Status: DC | PRN

## 2021-01-31 MED ORDER — NALOXONE HCL 0.4 MG/ML IJ SOLN: 0.4000 mg | INTRAMUSCULAR | Status: DC | PRN

## 2021-01-31 MED ORDER — TETANUS-DIPHTH-ACELL PERTUSSIS 5-2.5-18.5 LF-MCG/0.5 IM SUSY: 0.5000 mL | PREFILLED_SYRINGE | Freq: Once | INTRAMUSCULAR | Status: DC

## 2021-01-31 MED ORDER — PHENYLEPHRINE HCL-NACL 20-0.9 MG/250ML-% IV SOLN
INTRAVENOUS | Status: DC | PRN
  Administered 2021-01-31: 60 ug/min via INTRAVENOUS

## 2021-01-31 MED ORDER — MORPHINE SULFATE (PF) 0.5 MG/ML IJ SOLN
INTRAMUSCULAR | Status: AC
  Filled 2021-01-31: qty 10

## 2021-01-31 MED ORDER — DIPHENHYDRAMINE HCL 25 MG PO CAPS
25.0000 mg | ORAL_CAPSULE | Freq: Four times a day (QID) | ORAL | Status: DC | PRN
Start: 2021-01-31 — End: 2021-02-03

## 2021-01-31 MED ORDER — CHLOROPROCAINE HCL (PF) 3 % IJ SOLN
INTRAMUSCULAR | Status: AC
  Filled 2021-01-31: qty 20

## 2021-01-31 MED ORDER — MORPHINE SULFATE (PF) 0.5 MG/ML IJ SOLN
INTRAMUSCULAR | Status: DC | PRN
  Administered 2021-01-31: 150 ug via INTRATHECAL

## 2021-01-31 MED ORDER — SODIUM CHLORIDE 0.9% FLUSH: 3.0000 mL | INTRAVENOUS | Status: DC | PRN

## 2021-01-31 MED ORDER — FENTANYL CITRATE (PF) 100 MCG/2ML IJ SOLN
INTRAMUSCULAR | Status: DC | PRN
  Administered 2021-01-31: 15 ug via INTRATHECAL

## 2021-01-31 MED ORDER — ACETAMINOPHEN 500 MG PO TABS: 1000.0000 mg | ORAL_TABLET | Freq: Four times a day (QID) | ORAL | Status: DC

## 2021-01-31 MED ORDER — ACETAMINOPHEN 10 MG/ML IV SOLN
1000.0000 mg | Freq: Four times a day (QID) | INTRAVENOUS | Status: DC
  Administered 2021-01-31: 1000 mg via INTRAVENOUS
  Filled 2021-01-31 (×2): qty 100

## 2021-01-31 MED ORDER — ACETAMINOPHEN 10 MG/ML IV SOLN
INTRAVENOUS | Status: AC
  Filled 2021-01-31: qty 100

## 2021-01-31 MED ORDER — ACETAMINOPHEN 500 MG PO TABS: 1000.0000 mg | ORAL_TABLET | Freq: Once | ORAL | Status: DC

## 2021-01-31 MED ORDER — FENTANYL CITRATE (PF) 100 MCG/2ML IJ SOLN
INTRAMUSCULAR | Status: AC
  Filled 2021-01-31: qty 2

## 2021-01-31 MED ORDER — DEXAMETHASONE SODIUM PHOSPHATE 10 MG/ML IJ SOLN
INTRAMUSCULAR | Status: AC
  Filled 2021-01-31: qty 1

## 2021-01-31 MED ORDER — MENTHOL 3 MG MT LOZG: 1.0000 | LOZENGE | OROMUCOSAL | Status: DC | PRN

## 2021-01-31 MED ORDER — LACTATED RINGERS IV SOLN: INTRAVENOUS | Status: DC

## 2021-01-31 MED ORDER — PRENATAL MULTIVITAMIN CH
1.0000 | ORAL_TABLET | Freq: Every day | ORAL | Status: DC
  Administered 2021-02-01 – 2021-02-02 (×2): 1 via ORAL
  Filled 2021-01-31 (×2): qty 1

## 2021-01-31 MED ORDER — ACETAMINOPHEN 500 MG PO TABS
1000.0000 mg | ORAL_TABLET | Freq: Four times a day (QID) | ORAL | Status: DC
  Administered 2021-02-01 – 2021-02-03 (×9): 1000 mg via ORAL
  Filled 2021-01-31 (×10): qty 2

## 2021-01-31 MED ORDER — ZOLPIDEM TARTRATE 5 MG PO TABS: 5.0000 mg | ORAL_TABLET | Freq: Every evening | ORAL | Status: DC | PRN

## 2021-01-31 MED ORDER — DEXAMETHASONE SODIUM PHOSPHATE 10 MG/ML IJ SOLN
INTRAMUSCULAR | Status: DC | PRN
  Administered 2021-01-31: 10 mg via INTRAVENOUS

## 2021-01-31 MED ORDER — LACTATED RINGERS IV SOLN: INTRAVENOUS | Status: DC | PRN

## 2021-01-31 MED ORDER — ONDANSETRON HCL 4 MG/2ML IJ SOLN
INTRAMUSCULAR | Status: DC | PRN
  Administered 2021-01-31: 4 mg via INTRAVENOUS

## 2021-01-31 MED ORDER — MAGNESIUM SULFATE 40 GM/1000ML IV SOLN
2.0000 g/h | INTRAVENOUS | Status: AC
  Administered 2021-01-31 – 2021-02-01 (×2): 2 g/h via INTRAVENOUS
  Filled 2021-01-31: qty 1000

## 2021-01-31 MED ORDER — OXYCODONE HCL 5 MG PO TABS
5.0000 mg | ORAL_TABLET | ORAL | Status: DC | PRN
  Administered 2021-02-01: 5 mg via ORAL
  Administered 2021-02-01 – 2021-02-02 (×5): 10 mg via ORAL
  Administered 2021-02-03: 5 mg via ORAL
  Filled 2021-01-31 (×2): qty 2
  Filled 2021-01-31 (×2): qty 1
  Filled 2021-01-31 (×3): qty 2

## 2021-01-31 MED ORDER — CHLOROPROCAINE HCL (PF) 3 % IJ SOLN
INTRAMUSCULAR | Status: DC | PRN
  Administered 2021-01-31: 20 mL

## 2021-01-31 MED ORDER — KETOROLAC TROMETHAMINE 30 MG/ML IJ SOLN: 30.0000 mg | Freq: Once | INTRAMUSCULAR | Status: DC

## 2021-01-31 MED ORDER — SOD CITRATE-CITRIC ACID 500-334 MG/5ML PO SOLN
30.0000 mL | ORAL | Status: AC
  Administered 2021-01-31: 30 mL via ORAL
  Filled 2021-01-31: qty 15

## 2021-01-31 MED ORDER — HYDROMORPHONE HCL 1 MG/ML IJ SOLN
0.2000 mg | INTRAMUSCULAR | Status: DC | PRN
  Administered 2021-01-31 – 2021-02-01 (×2): 0.6 mg via INTRAVENOUS
  Filled 2021-01-31 (×2): qty 1

## 2021-01-31 MED ORDER — DIBUCAINE (PERIANAL) 1 % EX OINT: 1.0000 "application " | TOPICAL_OINTMENT | CUTANEOUS | Status: DC | PRN

## 2021-01-31 MED ORDER — DIPHENHYDRAMINE HCL 50 MG/ML IJ SOLN: 12.5000 mg | Freq: Four times a day (QID) | INTRAMUSCULAR | Status: DC | PRN

## 2021-01-31 MED ORDER — FENTANYL CITRATE (PF) 100 MCG/2ML IJ SOLN
25.0000 ug | INTRAMUSCULAR | Status: DC | PRN
  Administered 2021-01-31: 50 ug via INTRAVENOUS
  Administered 2021-01-31 (×2): 25 ug via INTRAVENOUS

## 2021-01-31 MED ORDER — OXYTOCIN-SODIUM CHLORIDE 30-0.9 UT/500ML-% IV SOLN
INTRAVENOUS | Status: DC | PRN
  Administered 2021-01-31: 200 mL via INTRAVENOUS
  Administered 2021-01-31: 300 mL via INTRAVENOUS

## 2021-01-31 MED ORDER — ACETAMINOPHEN 160 MG/5ML PO SOLN: 1000.0000 mg | Freq: Once | ORAL | Status: DC

## 2021-01-31 MED ORDER — POVIDONE-IODINE 10 % EX SWAB
2.0000 "application " | Freq: Once | CUTANEOUS | Status: AC
  Administered 2021-01-31: 2 via TOPICAL

## 2021-01-31 MED ORDER — PROMETHAZINE HCL 25 MG/ML IJ SOLN: 6.2500 mg | INTRAMUSCULAR | Status: DC | PRN

## 2021-01-31 MED ORDER — ONDANSETRON HCL 4 MG/2ML IJ SOLN
INTRAMUSCULAR | Status: AC
  Filled 2021-01-31: qty 4

## 2021-01-31 MED ORDER — SIMETHICONE 80 MG PO CHEW
80.0000 mg | CHEWABLE_TABLET | ORAL | Status: DC | PRN
  Administered 2021-02-02: 80 mg via ORAL
  Filled 2021-01-31: qty 1

## 2021-01-31 SURGICAL SUPPLY — 36 items
BENZOIN TINCTURE PRP APPL 2/3 (GAUZE/BANDAGES/DRESSINGS) ×2 IMPLANT
CHLORAPREP W/TINT 26ML (MISCELLANEOUS) ×2 IMPLANT
CLAMP CORD UMBIL (MISCELLANEOUS) IMPLANT
CLOSURE STERI STRIP 1/2 X4 (GAUZE/BANDAGES/DRESSINGS) ×2 IMPLANT
CLOTH BEACON ORANGE TIMEOUT ST (SAFETY) ×2 IMPLANT
DERMABOND ADVANCED (GAUZE/BANDAGES/DRESSINGS) ×1
DERMABOND ADVANCED .7 DNX12 (GAUZE/BANDAGES/DRESSINGS) ×1 IMPLANT
DRSG OPSITE POSTOP 4X10 (GAUZE/BANDAGES/DRESSINGS) ×2 IMPLANT
ELECT REM PT RETURN 9FT ADLT (ELECTROSURGICAL) ×2
ELECTRODE REM PT RTRN 9FT ADLT (ELECTROSURGICAL) ×1 IMPLANT
EXTRACTOR VACUUM KIWI (MISCELLANEOUS) IMPLANT
GLOVE BIO SURGEON STRL SZ 6.5 (GLOVE) ×2 IMPLANT
GLOVE BIOGEL PI IND STRL 6.5 (GLOVE) ×1 IMPLANT
GLOVE BIOGEL PI IND STRL 7.0 (GLOVE) ×2 IMPLANT
GLOVE BIOGEL PI INDICATOR 6.5 (GLOVE) ×1
GLOVE BIOGEL PI INDICATOR 7.0 (GLOVE) ×2
GOWN STRL REUS W/TWL LRG LVL3 (GOWN DISPOSABLE) ×4 IMPLANT
KIT ABG SYR 3ML LUER SLIP (SYRINGE) ×2 IMPLANT
NEEDLE HYPO 25X5/8 SAFETYGLIDE (NEEDLE) ×2 IMPLANT
NS IRRIG 1000ML POUR BTL (IV SOLUTION) ×2 IMPLANT
PACK C SECTION WH (CUSTOM PROCEDURE TRAY) ×2 IMPLANT
PAD ABD DERMACEA PRESS 5X9 (GAUZE/BANDAGES/DRESSINGS) ×2 IMPLANT
PAD OB MATERNITY 4.3X12.25 (PERSONAL CARE ITEMS) ×2 IMPLANT
PENCIL SMOKE EVAC W/HOLSTER (ELECTROSURGICAL) ×2 IMPLANT
SUT CHROMIC 2 0 SH (SUTURE) ×4 IMPLANT
SUT PDS AB 0 CTX 60 (SUTURE) ×2 IMPLANT
SUT PLAIN 0 NONE (SUTURE) IMPLANT
SUT PLAIN 2 0 (SUTURE) ×2
SUT PLAIN ABS 2-0 CT1 27XMFL (SUTURE) ×2 IMPLANT
SUT VIC AB 0 CT1 36 (SUTURE) ×2 IMPLANT
SUT VIC AB 0 CTX 36 (SUTURE) ×2
SUT VIC AB 0 CTX36XBRD ANBCTRL (SUTURE) ×2 IMPLANT
SUT VIC AB 4-0 PS2 27 (SUTURE) ×2 IMPLANT
TOWEL OR 17X24 6PK STRL BLUE (TOWEL DISPOSABLE) ×2 IMPLANT
TRAY FOLEY W/BAG SLVR 14FR LF (SET/KITS/TRAYS/PACK) IMPLANT
WATER STERILE IRR 1000ML POUR (IV SOLUTION) ×2 IMPLANT

## 2021-01-31 NOTE — Brief Op Note (Signed)
01/30/2021 - 01/31/2021  5:13 PM  PATIENT:  Anna Park  30 y.o. female  PRE-OPERATIVE DIAGNOSIS:  DI,DI TWINS; DESIRES STERILITY, severe preeclampsia, TWINS  POST-OPERATIVE DIAGNOSIS:  STERILITY, severe preeclampsia, TWINS, desires sterility  PROCEDURE:  Procedure(s): CESAREAN SECTION WITH BILATERAL TUBAL LIGATION EDC: 03-08-21 ALLERG: SULFA, SULFASALAZINE (Bilateral)  SURGEON:  Surgeon(s) and Role:    * Harold Hedge, MD - Primary  PHYSICIAN ASSISTANT:   ASSISTANTS: none   ANESTHESIA:   spinal  EBL:  1137 ml   BLOOD ADMINISTERED:none  DRAINS: Urinary Catheter (Foley)   LOCAL MEDICATIONS USED:  OTHER Nesacaine 20 ml  SPECIMEN:  Source of Specimen:  placentas, bilateral fallopian tube segments  DISPOSITION OF SPECIMEN:  PATHOLOGY  COUNTS:  YES  TOURNIQUET:  * No tourniquets in log *  DICTATION: .Other Dictation: Dictation Number  [id number]  PLAN OF CARE: Admit to inpatient   PATIENT DISPOSITION:  PACU - hemodynamically stable.   Delay start of Pharmacological VTE agent (>24hrs) due to surgical blood loss or risk of bleeding: not applicable

## 2021-01-31 NOTE — Progress Notes (Signed)
Patient developed 8/10 HA this am, HA now 6/10 after Tylenol Also bad RUQ pain No vision changes  Today's Vitals   01/31/21 0906 01/31/21 1045 01/31/21 1052 01/31/21 1229  BP: (!) 145/102 (!) 141/98  (!) 137/94  Pulse: (!) 113 94  88  Resp:  18  18  Temp:  98.5 F (36.9 C)    TempSrc:  Oral    SpO2:  99%    Weight:      Height:      PainSc:   7     Body mass index is 34.35 kg/m.  Abdomen mod RUQ tenderness without rebound DTR 2+ without clonus  Results for orders placed or performed during the hospital encounter of 01/30/21 (from the past 24 hour(s))  Urinalysis, Routine w reflex microscopic Urine, Clean Catch     Status: Abnormal   Collection Time: 01/30/21  4:13 PM  Result Value Ref Range   Color, Urine AMBER (A) YELLOW   APPearance HAZY (A) CLEAR   Specific Gravity, Urine 1.026 1.005 - 1.030   pH 5.0 5.0 - 8.0   Glucose, UA NEGATIVE NEGATIVE mg/dL   Hgb urine dipstick NEGATIVE NEGATIVE   Bilirubin Urine NEGATIVE NEGATIVE   Ketones, ur 5 (A) NEGATIVE mg/dL   Protein, ur 893 (A) NEGATIVE mg/dL   Nitrite NEGATIVE NEGATIVE   Leukocytes,Ua NEGATIVE NEGATIVE   RBC / HPF 0-5 0 - 5 RBC/hpf   WBC, UA 0-5 0 - 5 WBC/hpf   Bacteria, UA FEW (A) NONE SEEN   Squamous Epithelial / LPF 6-10 0 - 5   Mucus PRESENT   CBC     Status: Abnormal   Collection Time: 01/30/21  5:05 PM  Result Value Ref Range   WBC 9.2 4.0 - 10.5 K/uL   RBC 3.71 (L) 3.87 - 5.11 MIL/uL   Hemoglobin 10.6 (L) 12.0 - 15.0 g/dL   HCT 81.0 (L) 17.5 - 10.2 %   MCV 88.9 80.0 - 100.0 fL   MCH 28.6 26.0 - 34.0 pg   MCHC 32.1 30.0 - 36.0 g/dL   RDW 58.5 27.7 - 82.4 %   Platelets 222 150 - 400 K/uL   nRBC 0.0 0.0 - 0.2 %  Comprehensive metabolic panel     Status: Abnormal   Collection Time: 01/30/21  5:05 PM  Result Value Ref Range   Sodium 136 135 - 145 mmol/L   Potassium 3.5 3.5 - 5.1 mmol/L   Chloride 105 98 - 111 mmol/L   CO2 22 22 - 32 mmol/L   Glucose, Bld 99 70 - 99 mg/dL   BUN 10 6 - 20 mg/dL    Creatinine, Ser 2.35 0.44 - 1.00 mg/dL   Calcium 8.4 (L) 8.9 - 10.3 mg/dL   Total Protein 5.7 (L) 6.5 - 8.1 g/dL   Albumin 2.6 (L) 3.5 - 5.0 g/dL   AST 19 15 - 41 U/L   ALT 12 0 - 44 U/L   Alkaline Phosphatase 97 38 - 126 U/L   Total Bilirubin 0.4 0.3 - 1.2 mg/dL   GFR, Estimated >36 >14 mL/min   Anion gap 9 5 - 15  Protein / creatinine ratio, urine     Status: Abnormal   Collection Time: 01/30/21  5:05 PM  Result Value Ref Range   Creatinine, Urine 188.77 mg/dL   Total Protein, Urine 170 mg/dL   Protein Creatinine Ratio 0.90 (H) 0.00 - 0.15 mg/mg[Cre]  Type and screen  [facility]     Status:  None   Collection Time: 01/30/21  5:20 PM  Result Value Ref Range   ABO/RH(D) O POS    Antibody Screen NEG    Sample Expiration      02/02/2021,2359 Performed at [facility], [address], [place], [place] [address]   Resp Panel by RT-PCR (Flu A&B, Covid) Nasopharyngeal Swab     Status: None   Collection Time: 01/30/21  5:25 PM   Specimen: Nasopharyngeal Swab; Nasopharyngeal(NP) swabs in vial transport medium  Result Value Ref Range   SARS Coronavirus 2 by RT PCR NEGATIVE NEGATIVE   Influenza A by PCR NEGATIVE NEGATIVE   Influenza B by PCR NEGATIVE NEGATIVE  CBC on admission     Status: Abnormal   Collection Time: 01/31/21  4:55 AM  Result Value Ref Range   WBC 8.8 4.0 - 10.5 K/uL   RBC 3.47 (L) 3.87 - 5.11 MIL/uL   Hemoglobin 9.7 (L) 12.0 - 15.0 g/dL   HCT 67.5 (L) 91.6 - 38.4 %   MCV 88.8 80.0 - 100.0 fL   MCH 28.0 26.0 - 34.0 pg   MCHC 31.5 30.0 - 36.0 g/dL   RDW 66.5 99.3 - 57.0 %   Platelets 202 150 - 400 K/uL   nRBC 0.0 0.0 - 0.2 %  Comprehensive metabolic panel     Status: Abnormal   Collection Time: 01/31/21  9:20 AM  Result Value Ref Range   Sodium 134 (L) 135 - 145 mmol/L   Potassium 3.8 3.5 - 5.1 mmol/L   Chloride 106 98 - 111 mmol/L   CO2 20 (L) 22 - 32 mmol/L   Glucose, Bld 101 (H) 70 - 99 mg/dL   BUN 9 6 - 20 mg/dL   Creatinine, Ser  1.77 0.44 - 1.00 mg/dL   Calcium 8.8 (L) 8.9 - 10.3 mg/dL   Total Protein 5.3 (L) 6.5 - 8.1 g/dL   Albumin 2.4 (L) 3.5 - 5.0 g/dL   AST 20 15 - 41 U/L   ALT 10 0 - 44 U/L   Alkaline Phosphatase 90 38 - 126 U/L   Total Bilirubin 0.4 0.3 - 1.2 mg/dL   GFR, Estimated >93 >90 mL/min   Anion gap 8 5 - 15   A/P: preeclampsia with severe features         Recommend delivery. D/W patient and husband C/S with BTL. D/W procedure and risks including infection, organ damage, bleeding/transfusion-HIV/Hep, DVT/PE, pneumonia, wound breakdown. D/W BTL with permanence, failure rate and ectopic risk. Also D/W prematurity of babies. NICU consult has been placed. Patient states she understands and agrees.

## 2021-01-31 NOTE — Progress Notes (Signed)
Pt brought upstairs for scheduled c section via stretcher. Accompanied by her husband.

## 2021-01-31 NOTE — Consult Note (Signed)
Neonatology Consult to Antenatal Patient:  I was asked by Dr. Henderson Cloud to see this patient in order to provide antenatal counseling due to prematurity.  Anna Park was admitted 3/24 at 34.[redacted] weeks GA with twin pregnancy for pre-eclampsia now with severe features. She is currently not having active labor. She is BMZ complete 3/19.  No chorio or bleeding concerns and reassuring fetal status of both babies.  C-section planned due to malposition of one twin.  30yo at home is healthy.  They have good support.  Dad is a IT sales professional in Manufacturing systems engineer.    I spoke with the patient, husband and mGM. We discussed management of twins with expected delivery via c-section planned for in a couple hours, including usual DR management, possible respiratory complications and need for support, IV access, feedings (mother desires breast feeding, which was encouraged  And is agreeable to donor breast milk, and they are also open to formula if needed), LOS, Mortality and Morbidity, and long term outcomes. They did have the usual general questions at this time which I answered.   Thank you for asking me to see this patient.  Dineen Kid Leary Roca, MD Neonatologist  The total length of face-to-face or floor/unit time for this encounter was 25 minutes. Counseling and/or coordination of care was 40 minutes of the above.

## 2021-01-31 NOTE — Anesthesia Procedure Notes (Signed)
Spinal  Patient location during procedure: OR Start time: 01/31/2021 4:01 PM End time: 01/31/2021 4:04 PM Reason for block: surgical anesthesia Staffing Performed: anesthesiologist  Anesthesiologist: Kaylyn Layer, MD Preanesthetic Checklist Completed: patient identified, IV checked, risks and benefits discussed, monitors and equipment checked, pre-op evaluation and timeout performed Spinal Block Patient position: sitting Prep: DuraPrep and site prepped and draped Patient monitoring: heart rate, continuous pulse ox and blood pressure Approach: midline Location: L3-4 Injection technique: single-shot Needle Needle type: Pencan  Needle gauge: 24 G Needle length: 10 cm Assessment Sensory level: T4 Events: CSF return Additional Notes Risks, benefits, and alternative discussed. Patient gave consent to procedure. Prepped and draped in sitting position. Clear CSF obtained after one needle pass. Positive terminal aspiration. No pain or paraesthesias with injection. Patient tolerated procedure well. Vital signs stable. Amalia Greenhouse, MD

## 2021-01-31 NOTE — Anesthesia Preprocedure Evaluation (Addendum)
Anesthesia Evaluation  Patient identified by MRN, date of birth, ID band Patient awake    Reviewed: Allergy & Precautions, NPO status , Patient's Chart, lab work & pertinent test results  History of Anesthesia Complications Negative for: history of anesthetic complications  Airway Mallampati: II  TM Distance: >3 FB Neck ROM: Full    Dental no notable dental hx.    Pulmonary neg pulmonary ROS,    Pulmonary exam normal        Cardiovascular Normal cardiovascular exam     Neuro/Psych  Headaches, Anxiety    GI/Hepatic Neg liver ROS, GERD  Medicated,  Endo/Other  negative endocrine ROS  Renal/GU negative Renal ROS  negative genitourinary   Musculoskeletal negative musculoskeletal ROS (+)   Abdominal   Peds  Hematology  (+) anemia , Hgb 9.8, plts 191k   Anesthesia Other Findings Day of surgery medications reviewed with patient.  Reproductive/Obstetrics (+) Pregnancy (PreE, twin gestation)                            Anesthesia Physical Anesthesia Plan  ASA: III  Anesthesia Plan: Spinal   Post-op Pain Management:    Induction:   PONV Risk Score and Plan: 4 or greater and Treatment may vary due to age or medical condition, Ondansetron and Dexamethasone  Airway Management Planned: Natural Airway  Additional Equipment: None  Intra-op Plan:   Post-operative Plan:   Informed Consent: I have reviewed the patients History and Physical, chart, labs and discussed the procedure including the risks, benefits and alternatives for the proposed anesthesia with the patient or authorized representative who has indicated his/her understanding and acceptance.       Plan Discussed with: CRNA  Anesthesia Plan Comments:        Anesthesia Quick Evaluation

## 2021-01-31 NOTE — Transfer of Care (Signed)
Immediate Anesthesia Transfer of Care Note  Patient: Anna Park  Procedure(s) Performed: CESAREAN SECTION WITH BILATERAL TUBAL LIGATION EDC: 03-08-21 ALLERG: SULFA, SULFASALAZINE (Bilateral )  Patient Location: PACU  Anesthesia Type:Spinal  Level of Consciousness: awake  Airway & Oxygen Therapy: Patient Spontanous Breathing  Post-op Assessment: Report given to RN  Post vital signs: Reviewed and stable  Last Vitals:  Vitals Value Taken Time  BP 142/89 01/31/21 1725  Temp    Pulse 85 01/31/21 1728  Resp 24 01/31/21 1728  SpO2 98 % 01/31/21 1728  Vitals shown include unvalidated device data.  Last Pain:  Vitals:   01/31/21 1445  TempSrc: Oral  PainSc:          Complications: No complications documented.

## 2021-01-31 NOTE — Lactation Note (Signed)
This note was copied from a baby's chart. Lactation Consultation Note  Patient Name: Geneive Sandstrom [id number] Date: 01/31/2021 Reason for consult: Initial assessment;Late-preterm 34-36.6wks;Infant < 6lbs;NICU baby;Multiple gestation Age:31 hours  Visited with mom of 6 hours old LPI twin girls, she's a P3 and reported (+) breast changes during the pregnancy. She told LC she has already seen colostrum and she's familiar with hand expression. Mom is currently on Mag+ and not ready to start pumping yet, she asked LC if she could start pumping tomorrow; RN was in the room and was notified about her pumping status.  Reviewed benefits of STS care, breastmilk for NICU babies, especially when born early. Mom also had questions about breast massagers, breast pumps and supplies for pumping. Let her know that the NICU will provide all bottles needed for her to take home (she's anticipating to go home before the twins do). Babies are currently on donor milk. Reviewed breastmilk storage guidelines.  Feeding plan:  1. Encouraged mom to do some breast massage and hand expression when possible 2. She'll start pumping tomorrow, RN/LC will review instructions, cleaning and storage   BF brochure, BF resources and NICU booklet were reviewed. FOB present and supportive. Parents reported all questions and concerns were answered, they're both aware of LC OP services and will call PRN.   Maternal Data Has patient been taught Hand Expression?: Yes Does the patient have breastfeeding experience prior to this delivery?: Yes How long did the patient breastfeed?: 6 weeks  Feeding Mother's Current Feeding Choice: Breast Milk and Donor Milk  LATCH Score    Lactation Tools Discussed/Used    Interventions Interventions: Breast feeding basics reviewed  Discharge Pump: Personal (Medela DEBP at home) [facility] Program: No  Consult Status Consult Status: Follow-up Date: 02/01/21 Follow-up type:  In-patient    Milagros Venetia Constable 01/31/2021, 10:35 PM

## 2021-01-31 NOTE — Progress Notes (Signed)
34 6/7 Wks  Had some HA last pm relieved with Tylenol, no vision change Today feeling better, no chest pressure, no SOB   Today's Vitals   01/31/21 0115 01/31/21 0300 01/31/21 0411 01/31/21 0640  BP:   133/87   Pulse:   80   Resp:   17   Temp:   98.4 F (36.9 C)   TempSrc:   Oral   SpO2:   99%   PainSc: Asleep Asleep  5    There is no height or weight on file to calculate BMI.   NST reactive x 2  Results for orders placed or performed during the hospital encounter of 01/30/21 (from the past 24 hour(s))  Urinalysis, Routine w reflex microscopic Urine, Clean Catch     Status: Abnormal   Collection Time: 01/30/21  4:13 PM  Result Value Ref Range   Color, Urine AMBER (A) YELLOW   APPearance HAZY (A) CLEAR   Specific Gravity, Urine 1.026 1.005 - 1.030   pH 5.0 5.0 - 8.0   Glucose, UA NEGATIVE NEGATIVE mg/dL   Hgb urine dipstick NEGATIVE NEGATIVE   Bilirubin Urine NEGATIVE NEGATIVE   Ketones, ur 5 (A) NEGATIVE mg/dL   Protein, ur 834 (A) NEGATIVE mg/dL   Nitrite NEGATIVE NEGATIVE   Leukocytes,Ua NEGATIVE NEGATIVE   RBC / HPF 0-5 0 - 5 RBC/hpf   WBC, UA 0-5 0 - 5 WBC/hpf   Bacteria, UA FEW (A) NONE SEEN   Squamous Epithelial / LPF 6-10 0 - 5   Mucus PRESENT   CBC     Status: Abnormal   Collection Time: 01/30/21  5:05 PM  Result Value Ref Range   WBC 9.2 4.0 - 10.5 K/uL   RBC 3.71 (L) 3.87 - 5.11 MIL/uL   Hemoglobin 10.6 (L) 12.0 - 15.0 g/dL   HCT 19.6 (L) 22.2 - 97.9 %   MCV 88.9 80.0 - 100.0 fL   MCH 28.6 26.0 - 34.0 pg   MCHC 32.1 30.0 - 36.0 g/dL   RDW 89.2 11.9 - 41.7 %   Platelets 222 150 - 400 K/uL   nRBC 0.0 0.0 - 0.2 %  Comprehensive metabolic panel     Status: Abnormal   Collection Time: 01/30/21  5:05 PM  Result Value Ref Range   Sodium 136 135 - 145 mmol/L   Potassium 3.5 3.5 - 5.1 mmol/L   Chloride 105 98 - 111 mmol/L   CO2 22 22 - 32 mmol/L   Glucose, Bld 99 70 - 99 mg/dL   BUN 10 6 - 20 mg/dL   Creatinine, Ser 4.08 0.44 - 1.00 mg/dL   Calcium 8.4  (L) 8.9 - 10.3 mg/dL   Total Protein 5.7 (L) 6.5 - 8.1 g/dL   Albumin 2.6 (L) 3.5 - 5.0 g/dL   AST 19 15 - 41 U/L   ALT 12 0 - 44 U/L   Alkaline Phosphatase 97 38 - 126 U/L   Total Bilirubin 0.4 0.3 - 1.2 mg/dL   GFR, Estimated >14 >48 mL/min   Anion gap 9 5 - 15  Protein / creatinine ratio, urine     Status: Abnormal   Collection Time: 01/30/21  5:05 PM  Result Value Ref Range   Creatinine, Urine 188.77 mg/dL   Total Protein, Urine 170 mg/dL   Protein Creatinine Ratio 0.90 (H) 0.00 - 0.15 mg/mg[Cre]  Type and screen [facility]     Status: None   Collection Time: 01/30/21  5:20 PM  Result Value Ref Range   ABO/RH(D) O POS    Antibody Screen NEG    Sample Expiration      02/02/2021,2359 Performed at [facility], [address] [place], [place] [address]   Resp Panel by RT-PCR (Flu A&B, Covid) Nasopharyngeal Swab     Status: None   Collection Time: 01/30/21  5:25 PM   Specimen: Nasopharyngeal Swab; Nasopharyngeal(NP) swabs in vial transport medium  Result Value Ref Range   SARS Coronavirus 2 by RT PCR NEGATIVE NEGATIVE   Influenza A by PCR NEGATIVE NEGATIVE   Influenza B by PCR NEGATIVE NEGATIVE  CBC on admission     Status: Abnormal   Collection Time: 01/31/21  4:55 AM  Result Value Ref Range   WBC 8.8 4.0 - 10.5 K/uL   RBC 3.47 (L) 3.87 - 5.11 MIL/uL   Hemoglobin 9.7 (L) 12.0 - 15.0 g/dL   HCT 19.3 (L) 79.0 - 24.0 %   MCV 88.8 80.0 - 100.0 fL   MCH 28.0 26.0 - 34.0 pg   MCHC 31.5 30.0 - 36.0 g/dL   RDW 97.3 53.2 - 99.2 %   Platelets 202 150 - 400 K/uL   nRBC 0.0 0.0 - 0.2 %    A/P: Twins-Vtx/Transverse                    C/S with BTL for delivery                    BMZ  3/18 and 3/19                    NST BID          Preeclampsia without severe features                    Had two elevated BP last pm that did not require treatement-normal this morning                    Labs normal                    Continue to monitor closely-labs  tomorrow                    Delivery if BP/labs worsen or if fetal status not reassuring

## 2021-02-01 ENCOUNTER — Encounter (HOSPITAL_COMMUNITY): Payer: Self-pay | Admitting: Obstetrics and Gynecology

## 2021-02-01 LAB — CBC
HCT: 27.6 % — ABNORMAL LOW (ref 36.0–46.0)
HCT: 27.1 % — ABNORMAL LOW (ref 36.0–46.0)
Hemoglobin: 8.9 g/dL — ABNORMAL LOW (ref 12.0–15.0)
Hemoglobin: 9.1 g/dL — ABNORMAL LOW (ref 12.0–15.0)
MCH: 28.4 pg (ref 26.0–34.0)
MCH: 29.1 pg (ref 26.0–34.0)
MCHC: 32.2 g/dL (ref 30.0–36.0)
MCHC: 33.6 g/dL (ref 30.0–36.0)
MCV: 88.2 fL (ref 80.0–100.0)
MCV: 86.6 fL (ref 80.0–100.0)
Platelets: 200 10*3/uL (ref 150–400)
Platelets: 209 10*3/uL (ref 150–400)
RBC: 3.13 MIL/uL — ABNORMAL LOW (ref 3.87–5.11)
RBC: 3.13 MIL/uL — ABNORMAL LOW (ref 3.87–5.11)
RDW: 15.1 % (ref 11.5–15.5)
RDW: 15.2 % (ref 11.5–15.5)
WBC: 13.2 10*3/uL — ABNORMAL HIGH (ref 4.0–10.5)
WBC: 12.7 10*3/uL — ABNORMAL HIGH (ref 4.0–10.5)
nRBC: 0 % (ref 0.0–0.2)
nRBC: 0 % (ref 0.0–0.2)

## 2021-02-01 LAB — COMPREHENSIVE METABOLIC PANEL
ALT: 11 U/L (ref 0–44)
AST: 18 U/L (ref 15–41)
Albumin: 2.1 g/dL — ABNORMAL LOW (ref 3.5–5.0)
Alkaline Phosphatase: 75 U/L (ref 38–126)
Anion gap: 7 (ref 5–15)
BUN: 5 mg/dL — ABNORMAL LOW (ref 6–20)
CO2: 22 mmol/L (ref 22–32)
Calcium: 7.6 mg/dL — ABNORMAL LOW (ref 8.9–10.3)
Chloride: 102 mmol/L (ref 98–111)
Creatinine, Ser: 0.47 mg/dL (ref 0.44–1.00)
GFR, Estimated: >60 mL/min (ref >60)
Glucose, Bld: 112 mg/dL — ABNORMAL HIGH (ref 70–99)
Potassium: 4.1 mmol/L (ref 3.5–5.1)
Sodium: 131 mmol/L — ABNORMAL LOW (ref 135–145)
Total Bilirubin: 0.7 mg/dL (ref 0.3–1.2)
Total Protein: 4.8 g/dL — ABNORMAL LOW (ref 6.5–8.1)

## 2021-02-01 MED ORDER — IBUPROFEN 600 MG PO TABS
600.0000 mg | ORAL_TABLET | Freq: Four times a day (QID) | ORAL | Status: DC | PRN
  Administered 2021-02-01 – 2021-02-03 (×6): 600 mg via ORAL
  Filled 2021-02-01 (×6): qty 1

## 2021-02-01 NOTE — Op Note (Signed)
NAMELOUNETTE, SLOAN MEDICAL RECORD NO: [id number] ACCOUNT NO: [id number] DATE OF BIRTH: 1991-09-26 FACILITY: [facility] LOCATION: [facility] PHYSICIAN: Guy Sandifer. Arleta Creek, MD  Operative Report   DATE OF PROCEDURE: 01/31/2021  PREOPERATIVE DIAGNOSES:   1.  Intrauterine pregnancy at 63 and 6/7th weeks. 2.  Twins. 3.  Severe preeclampsia. 4.  Desires permanent sterilization.  POSTOPERATIVE DIAGNOSES:   1.  Intrauterine pregnancy at 31 and 6/7th weeks. 2.  Twins. 3.  Severe preeclampsia. 4.  Desires permanent sterilization.  PROCEDURE:   1.  Low transverse cesarean section. 2.  Bilateral tubal ligation.  SURGEON:  Guy Sandifer. Axavier Pressley II, MD  ANESTHESIA:  Spinal.  ESTIMATED BLOOD LOSS:  1137 mL  FINDINGS:  Baby A, viable female infant; Apgars, arterial cord pH, birth weight pending.  Baby B, viable female infant; birth weight,  cord pH and Apgars pending.  SPECIMENS:  Placentas and bilateral fallopian tube segments to pathology.  INDICATIONS AND CONSENT:  This patient is a 30 year old G3, P1 at 34-6/7th weeks with di-di twin gestation.  Babies are vertex, transverse.  Cesarean section with tubal ligation is planned for delivery.  She was admitted approximately one week ago with  preeclampsia without severe features.  She received betamethasone on 03/18 and 03/19.  She was discharged home three days ago.  She was readmitted yesterday with complaints of dizziness, chest pressure, headache and just not feeling well.  Labs were  normal.  She was carefully observed.  This morning, she developed a bad headache as well as bad right upper quadrant pain.  Laboratories returned as normal.  With the features of severe headache and persistent severe right upper quadrant pain, delivery  is recommended.  The patient had consultation with neonatology.  Cesarean section and tubal ligation were discussed and potential risks and complications were discussed preoperatively including but not limited to  infection, organ damage, bleeding  requiring transfusion of blood products with HIV and hepatitis acquisition, DVT, PE, pneumonia, wound breakdown.  Permanence of tubal ligation, failure rate, and increased ectopic risk were also discussed.  The patient states she understands and agrees  and consent is signed and on the chart.  DESCRIPTION OF PROCEDURE:  The patient is taken to the operating room where she is identified.  Spinal anesthetic is placed and she is placed in the dorsal supine position with a 15-degree left lateral wedge.  She is prepped vaginally with Betadine.   Foley catheter is placed and she is prepped abdominally with ChloraPrep.  Timeout is undertaken.  After 3-minute drying time, she is draped in a sterile fashion.  After testing for adequate spinal anesthesia, skin is entered through a Pfannenstiel  incision and dissection is carried out in layers of the peritoneum.  The peritoneum is taken down superiorly and inferiorly.  Vesicouterine peritoneum is taken down cephalad laterally and the bladder flap is developed and the bladder blade is placed.   Uterus is incised in a low transverse manner and the uterine cavity is entered bluntly with a hemostat.  Clear fluid is noted for baby A.  Baby A is delivered from the vertex position with nuchal cord loose x1.  Good cry and tone is noted and after 1  minute, cord is clamped and cut and the baby is handed to waiting pediatrics team.  Artificial rupture of membranes for baby B is carried out for clear fluid.  This baby is delivered from the footling breech position.  Good cry and tone is noted and  after 1 minute,  the cord is clamped and cut and the baby is handed to waiting pediatrics team.  The placentas are delivered and sent to pathology.  Uterine cavity is clean.  Uterus is closed in 2 running locking imbricating layers of 0 Monocryl suture.   A third layer of 2-0 chromic is used to reapproximate the bladder flap to achieve complete  hemostasis.  The left fallopian tube is identified from cornu to fimbria.  The patient had some discomfort here and about 10 mL of Nesacaine poured on to the left  tube, which gave good relief.  The left ovary is normal.  The left fallopian tube is grasped in its mid ampullary portion and a loop of tube is tied in a knuckle with 2 free ties of 2-0 plain suture.  The intervening knuckle is sharply resected and  cautery is used to assure complete hemostasis.  Similar procedure is carried out on the right side.  The right ovary is normal.  Nesacaine is again poured on this fallopian tube as well.  Portion of tube is again resected in a similar fashion.  Lavage is  carried out and all returned as clear.  Anterior peritoneum is closed in running fashion with 0 Monocryl suture, which is also used to reapproximate the pyramidalis muscle in the midline.  Anterior rectus fascia is closed in a running fashion with a 0  looped PDS.  Subcutaneous layer is closed with interrupted 2-0 plain and the skin is closed in a subcuticular fashion with 4-0 Vicryl on a Keith needle.  Steris, benzoin, honeycomb and pressure dressing are applied.  All counts are correct and the  patient is taken to recovery room in stable condition.   SHW D: 01/31/2021 5:24:47 pm T: 02/01/2021 7:00:00 am  JOB: [id number]/ [id number]

## 2021-02-01 NOTE — Plan of Care (Signed)
Patient has been up to NICU. Pumping. Reports pain at 4/10 but acceptable for patient no further requests. Will work on drinking water, voiding and walking x1 this PM. No s/s of pre-e. Patient to report to RN any changes in condition.

## 2021-02-01 NOTE — Lactation Note (Addendum)
This note was copied from a baby's chart. Lactation Consultation Note  Patient Name: Anna Park [id number] Date: 02/01/2021 Reason for consult: NICU baby;Multiple gestation;Follow-up assessment Age:31 hours LC assisted with initial pumping at 16 hours p delivery. Reviewed importance of frequent breast stimulation. Mom on Mag and feeling unwell. FOB available to assist. Mom at risk of delay of onset of copious milk because of pp blood loss (1134g).  Maternal Data Has patient been taught Hand Expression?: Yes Does the patient have breastfeeding experience prior to this delivery?: Yes How long did the patient breastfeed?: 6 weeks Breasts symmetrical and well developed. Nipples wnl.  Feeding Mother's Current Feeding Choice: Breast Milk and Donor Milk  Interventions Interventions: Education;Expressed milk;Breast massage;Hand express;DEBP;Breast compression  Discharge Pump: DEBP;Personal (Medela pis)  Consult Status Consult Status: Follow-up Follow-up type: In-patient   Elder Negus, MA IBCLC 02/01/2021, 10:58 AM

## 2021-02-01 NOTE — Progress Notes (Signed)
Subjective: Postpartum Day 1: Cesarean Delivery Patient reports tolerating PO.   Very dizzy when up on feet once this am Has some HA  Objective: Vital signs in last 24 hours: Temp:  [97.6 F (36.4 C)-98.7 F (37.1 C)] 98.4 F (36.9 C) (03/26 0740) Pulse Rate:  [71-122] 86 (03/26 0740) Resp:  [16-20] 19 (03/26 0740) BP: (109-159)/(73-104) 130/79 (03/26 0740) SpO2:  [90 %-100 %] 99 % (03/26 0435) UO clear, 700 ml in bag  Physical Exam:  General: alert, cooperative and no distress Lochia: appropriate Uterine Fundus: firm Incision: healing well DVT Evaluation: No evidence of DVT seen on physical exam. DTR 1+ Recent Labs    01/31/21 1215 02/01/21 0415  HGB 9.8* 8.9*  HCT 30.8* 27.6*   Results for orders placed or performed during the hospital encounter of 01/30/21 (from the past 24 hour(s))  Comprehensive metabolic panel     Status: Abnormal   Collection Time: 01/31/21  9:20 AM  Result Value Ref Range   Sodium 134 (L) 135 - 145 mmol/L   Potassium 3.8 3.5 - 5.1 mmol/L   Chloride 106 98 - 111 mmol/L   CO2 20 (L) 22 - 32 mmol/L   Glucose, Bld 101 (H) 70 - 99 mg/dL   BUN 9 6 - 20 mg/dL   Creatinine, Ser 1.75 0.44 - 1.00 mg/dL   Calcium 8.8 (L) 8.9 - 10.3 mg/dL   Total Protein 5.3 (L) 6.5 - 8.1 g/dL   Albumin 2.4 (L) 3.5 - 5.0 g/dL   AST 20 15 - 41 U/L   ALT 10 0 - 44 U/L   Alkaline Phosphatase 90 38 - 126 U/L   Total Bilirubin 0.4 0.3 - 1.2 mg/dL   GFR, Estimated >10 >25 mL/min   Anion gap 8 5 - 15  CBC     Status: Abnormal   Collection Time: 01/31/21 12:15 PM  Result Value Ref Range   WBC 8.0 4.0 - 10.5 K/uL   RBC 3.48 (L) 3.87 - 5.11 MIL/uL   Hemoglobin 9.8 (L) 12.0 - 15.0 g/dL   HCT 85.2 (L) 77.8 - 24.2 %   MCV 88.5 80.0 - 100.0 fL   MCH 28.2 26.0 - 34.0 pg   MCHC 31.8 30.0 - 36.0 g/dL   RDW 35.3 61.4 - 43.1 %   Platelets 191 150 - 400 K/uL   nRBC 0.0 0.0 - 0.2 %  CBC     Status: Abnormal   Collection Time: 02/01/21  4:15 AM  Result Value Ref Range   WBC  13.2 (H) 4.0 - 10.5 K/uL   RBC 3.13 (L) 3.87 - 5.11 MIL/uL   Hemoglobin 8.9 (L) 12.0 - 15.0 g/dL   HCT 54.0 (L) 08.6 - 76.1 %   MCV 88.2 80.0 - 100.0 fL   MCH 28.4 26.0 - 34.0 pg   MCHC 32.2 30.0 - 36.0 g/dL   RDW 95.0 93.2 - 67.1 %   Platelets 200 150 - 400 K/uL   nRBC 0.0 0.0 - 0.2 %  Comprehensive metabolic panel     Status: Abnormal   Collection Time: 02/01/21  4:15 AM  Result Value Ref Range   Sodium 131 (L) 135 - 145 mmol/L   Potassium 4.1 3.5 - 5.1 mmol/L   Chloride 102 98 - 111 mmol/L   CO2 22 22 - 32 mmol/L   Glucose, Bld 112 (H) 70 - 99 mg/dL   BUN 5 (L) 6 - 20 mg/dL   Creatinine, Ser 2.45 0.44 - 1.00 mg/dL  Calcium 7.6 (L) 8.9 - 10.3 mg/dL   Total Protein 4.8 (L) 6.5 - 8.1 g/dL   Albumin 2.1 (L) 3.5 - 5.0 g/dL   AST 18 15 - 41 U/L   ALT 11 0 - 44 U/L   Alkaline Phosphatase 75 38 - 126 U/L   Total Bilirubin 0.7 0.3 - 1.2 mg/dL   GFR, Estimated >02 >11 mL/min   Anion gap 7 5 - 15    Assessment/Plan: Status post Cesarean section. Doing well postoperatively.  CBC @ noon D/C magnesium sulfate @ 1600 D/W patient repeat CBC @ noon and possible anemia and magnesium contributing to dizziness If she remains symptomatic and repeat CBC confirms a lower Hgb D/W possible transfusion of PRBC and risks including Hep/HIV and transfusion reaction. Ibuprofen prn  Anna Park II 02/01/2021, 8:14 AM

## 2021-02-01 NOTE — Anesthesia Postprocedure Evaluation (Signed)
Anesthesia Post Note  Patient: Anna Park  Procedure(s) Performed: CESAREAN SECTION WITH BILATERAL TUBAL LIGATION EDC: 03-08-21 ALLERG: SULFA, SULFASALAZINE (Bilateral )     Patient location during evaluation: PACU Anesthesia Type: Spinal Level of consciousness: awake and alert and oriented Pain management: pain level controlled Vital Signs Assessment: post-procedure vital signs reviewed and stable Respiratory status: spontaneous breathing, nonlabored ventilation and respiratory function stable Cardiovascular status: blood pressure returned to baseline Postop Assessment: no apparent nausea or vomiting, spinal receding, no headache and no backache Anesthetic complications: no   No complications documented.             Kaylyn Layer

## 2021-02-02 NOTE — Progress Notes (Signed)
Subjective: Postpartum Day 2: Cesarean Delivery Patient reports tolerating PO, + flatus and no problems voiding.   Feels much better off of magnesium. Ambulating, voiding, up to NICU to see babies. Objective: Vital signs in last 24 hours: Temp:  [97.7 F (36.5 C)-98.4 F (36.9 C)] 98 F (36.7 C) (03/27 0404) Pulse Rate:  [81-101] 81 (03/27 0404) Resp:  [16-18] 18 (03/27 0404) BP: (120-136)/(79-94) 120/87 (03/27 0404) SpO2:  [97 %-100 %] 100 % (03/27 0404)  Physical Exam:  General: alert, cooperative and no distress Lochia: appropriate Uterine Fundus: firm Incision: healing well DVT Evaluation: No evidence of DVT seen on physical exam. Sitting up pumping Recent Labs    02/01/21 0415 02/01/21 1147  HGB 8.9* 9.1*  HCT 27.6* 27.1*    Assessment/Plan: Status post Cesarean section. Doing well postoperatively.  Continue current care.  Roselle Locus II 02/02/2021, 8:09 AM

## 2021-02-02 NOTE — Lactation Note (Signed)
This note was copied from a baby's chart. Lactation Consultation Note  Patient Name: Anna Park ZOXWR'U Date: 02/02/2021 Reason for consult: NICU baby;Multiple gestation;Follow-up assessment Age:31 hours Mom continues to pump q3. She is seeing drops today. Reviewed normalcy. Encouraged hand expression to increase output. Goal today is 3-21mLs per pumping. Answered all questions. Will plan f/u to check progress. Will assist with phase 1 of IDF prn.   Expected pumping volumes: Day 0-2 = approx. 64mL / 24 hours Day 3-7 = > / 24 hours Day 7-14 = > / 24 hours  Maternal Data Has patient been taught Hand Expression?: Yes  Feeding Mother's Current Feeding Choice: Breast Milk and Donor Milk   Lactation Tools Discussed/Used Tools: Pump Breast pump type: Double-Electric Breast Pump Pump Education: Setup, frequency, and cleaning;Milk Storage Reason for Pumping: NICU Pumping frequency: q3 Pumped volume: 2 mL  Interventions Interventions: Education;Breast compression;Hand express   Consult Status Consult Status: Follow-up Follow-up type: In-patient   Elder Negus, MA IBCLC 02/02/2021, 10:19 AM

## 2021-02-03 ENCOUNTER — Ambulatory Visit: Payer: Self-pay

## 2021-02-03 ENCOUNTER — Encounter (HOSPITAL_COMMUNITY): Payer: Self-pay | Admitting: Obstetrics and Gynecology

## 2021-02-03 MED ORDER — OXYCODONE-ACETAMINOPHEN 5-325 MG PO TABS: 1.0000 | ORAL_TABLET | Freq: Four times a day (QID) | ORAL | 0 refills | Status: AC | PRN

## 2021-02-03 MED ORDER — MAGNESIUM HYDROXIDE 400 MG/5ML PO SUSP
30.0000 mL | Freq: Every day | ORAL | Status: DC | PRN
  Filled 2021-02-03: qty 30

## 2021-02-03 MED ORDER — IBUPROFEN 600 MG PO TABS: 600.0000 mg | ORAL_TABLET | Freq: Four times a day (QID) | ORAL | 0 refills | Status: AC | PRN

## 2021-02-03 NOTE — Clinical Social Work Maternal (Signed)
CLINICAL SOCIAL WORK MATERNAL/CHILD NOTE  Patient Details  Name: Anna Park MRN: [id number] Date of Birth: 05/01/1991  Date:  Oct 10, 2021  Clinical Social Worker Initiating Note:  Anna Park, [place] Date/Time: Initiated:  02/03/21/1402     Child's Name:  Anna Park;; [place] Parents:  Mother,Father (Father: Anna Park)   Need for Interpreter:  None   Reason for Referral:  [place] (Comment) (Infant's NICU admission)   Address:  [address] [place] [address]    Phone number:  [phone] (home)     Additional phone number:   Household Members/Support Persons (HM/SP):   Household Member/Support Person 1,Household Member/Support Person 2,Household Member/Support Person 3   HM/SP Name Relationship DOB or Age  HM/SP -[place] FOB/Husband    HM/SP -[place] daughter 03/24/16  HM/SP -3   [name]    HM/SP -4        HM/SP -5        HM/SP -6        HM/SP -7        HM/SP -8          Natural Supports (not living in the home):  Friends,Extended Radiation protection practitioner Supports: None   Employment: Animator   Type of Work: Passenger transport manager:  Public librarian arranged:    Museum/gallery curator Resources:  Multimedia programmer   Other Resources:      Cultural/Religious Considerations Which May Impact Care:    Strengths:  Ability to meet basic needs ,Pediatrician chosen,Home prepared for child ,Understanding of illness   Psychotropic Medications:         Pediatrician:    [place] area  Pediatrician List:   [place] Other ([facility])  [facility]      Pediatrician Fax Number:    Risk Factors/Current Problems:  Mental Health Concerns    Cognitive State:  Able to Concentrate ,Alert ,Insightful ,Goal Oriented ,Linear Thinking     Mood/Affect:  Comfortable ,Calm ,Interested ,Relaxed    CSW Assessment: CSW met with [name] at bedside to discuss behavioral health concerns and infants NICU admissions. CSW introduced self, FOB present. [name] granted CSW verbal permission to speak in front of FOB about anything. CSW explained reason for consult. [name] was welcoming, pleasant, open and remained engaged during assessment. [name] reported that she resides with her Husband/FOB, daughter and father. 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 [name] reported that they have all items needed to care for infants including car seats and basinets. CSW inquired about [name]'s support system, [name] reported that FOB, grandparents, best friend, teacher friends and fire department family are supports. [name] reported that she has a good support system.   CSW inquired about [name]'s mental health history. [name] reported that she was diagnosed with anxiety in 2013 or 2014 while in college. [name] reported that she is currently prescribed/taking Zoloft which is helpful. [name] denied any current anxiety symptoms. [name] endorsed perhaps having postpartum anxiety mixed with her general anxiety after having her oldest daughter. [name] reported that she started medication which was helpful when they found the right medication. CSW and [name] discussed edinburgh score 11. [name] attributed edinburgh score to anxiety surrounding adjusting to infants NICU admission and circumstances. [name] shared that they recently purchased a new home. CSW acknowledged and validated [name]'s  feelings. [name] reported that she had not been taking her medication during her hospitalization but plans to restart medication tonight. CSW positively affirmed [name]'s plans and inquired about [name]'s coping skills, [name] reported that breathing techniques are helpful. CSW inquired about how [name] was feeling emotionally since giving birth, [name] reported that she was feeling okay. [name] presented calm and had great insight  regarding her mental health. [name] did not demonstrate any acute mental health signs/symtpoms. CSW assessed for safety, [name] denied SI and HI. CSW did not assess for domestic violence as FOB was present.    CSW provided education regarding the baby blues period vs. perinatal mood disorders, discussed treatment and gave resources for mental health follow up if concerns arise.  CSW recommends self-evaluation during the postpartum time period using the New Mom Checklist from Postpartum Progress and encouraged [name] to contact a medical professional if symptoms are noted at any time.    CSW provided review of Sudden Infant Death Syndrome (SIDS) precautions.   CSW and [name] discussed infants NICU admissions. CSW informed [name] about the NICU, what to expect and resources/supports available while infants are admitted to the NICU. [name] reported that they feel well informed about infants care and denied any transportation barriers with visiting infants in the NICU. [name] reported that meal vouchers would be helpful, CSW provided 6 meal vouchers. [name] denied additional needs/concerns regarding the NICU.    CSW will continue to offer resources/supports while infants are admitted to the NICU.    CSW Plan/Description:  Sudden Infant Death Syndrome (SIDS) Education,Perinatal Mood and Anxiety Disorder (PMADs) Education,Other Patient/Family Education,Other Information/Referral to Community Resources,Psychosocial Support and Ongoing Assessment of Needs    Anna Park L Anna Kravitz, LCSW 02/03/2021, 2:07 PM 

## 2021-02-03 NOTE — Progress Notes (Signed)
Pt completed Edinburgh Postnatal Depression Screen this morning and score was 11.  Social Work consult placed.  MD wrote discharge for patient this morning.  Pt would like to be discharged, go visit twin babies in NICU, and leave hospital from there.  Discharge instructions reviewed with pt and husband together.  Pt ambulated with husband to NICU.  No equipment for home ordered at discharge.  Phoned Celso Sickle, social worker, and informed her of above.  She said that she would see the patient in the NICU.

## 2021-02-03 NOTE — Lactation Note (Addendum)
This note was copied from a baby's chart. Lactation Consultation Note  Patient Name: Anna Park'F Date: 02/03/2021   Age:31 hours  Belly bands were provided to assist Mom & Dad with doing skin-to-skin. Mom looked to be in discomfort; Mom admitted that she was in pain and agreed that I could let the NICU RN know. Huntley Dec, NICU RN was made aware and went to room.  There is a lactation appt for Mom & twins tomorrow at 11am. Remigio Eisenmenger 02/03/2021, 2:49 PM

## 2021-02-03 NOTE — Discharge Summary (Signed)
Postpartum Discharge Summary     Patient Name: Anna Park DOB: 09/22/1991 MRN: [id number]  Date of admission: 01/30/2021 Delivery date:   Anna Park [[id number]]  01/31/2021    Anna Park [[id number]]  01/31/2021   Delivering provider:    Gowri, Park [[id number]]  Anna Park    Anna Park [[id number]]  Anna Park   Date of discharge: 02/03/2021  Admitting diagnosis: Pregnancy, twins [O30.009] Intrauterine pregnancy: [redacted]w[redacted]d    Secondary diagnosis:  Active Problems:   Pregnancy, twins  Additional problems: severe preeclampsia    Discharge diagnosis: Preterm Pregnancy Delivered and Gestational Hypertension                                              Post partum procedures:BTL Augmentation: N/A Complications: None  Hospital course: Sceduled C/S   30y.o. yo [id number] admitted to the hospital 01/30/2021 for scheduled cesarean section with the following indication:Multifetal Gestation.Delivery details are as follows:  Membrane Rupture Time/Date:    [name], [name][[id number]]    [name], [name][[id number]]  ,   [name], [name][[id number]]    [name], [name][[id number]]    Delivery Method:   Anna ParkC-Section, Low Transverse    Anna ParkC-Section, Low Transverse   Details of operation can be found in separate operative note.  Patient had an uncomplicated postpartum course.  She is ambulating, tolerating a regular diet, passing flatus, and urinating well. Patient is discharged home in stable condition on  02/03/21        Newborn Data: Birth date:   Anna Park3/25/2022    Anna Park3/25/2022   Birth time:   Anna Park4:23 PM    Anna Park4:25 PM   Gender:   Anna ParkFemale    Anna ParkFemale   Living status:   TeBradley, Handyside0[[id number]]  VBTYOM    Anna ParkLiving   Apgars:   Anna Park8 [address]Anna Park8 [place][[id number]]8 [address]Anna Park9   Weight:   Anna Park2140 g    Anna Park2650 g      Magnesium Sulfate received: Yes: Seizure prophylaxis BMZ received: Yes Rhophylac:N/A MMR:N/A T-DaP:Given prenatally Flu: Yes Transfusion:No  Physical exam  Vitals:   02/02/21 2010 02/03/21 0050 02/03/21 0514 02/03/21 0744  BP:  135/90 122/64 129/81  Pulse:  90 88 90  Resp:  18 18 18   Temp:  98 F (36.7 C) 98 F (36.7 C) 98.6 F (37 C)  TempSrc:  Oral Oral Oral  SpO2: 99% 99% 99% 99%  Weight:      Height:       General: alert, cooperative and no distress Lochia: appropriate Uterine Fundus: firm Incision: Healing well with no significant drainage, Dressing is clean, dry, and intact DVT Evaluation: No evidence of DVT seen on physical exam. Negative Homan's sign. Labs: Lab Results  Component Value Date   WBC 12.7 (  H) 02/01/2021   HGB 9.1 (L) 02/01/2021   HCT 27.1 (L) 02/01/2021   MCV 86.6 02/01/2021   PLT 209 02/01/2021   CMP Latest Ref Rng & Units 02/01/2021  Glucose 70 - 99 mg/dL 112(H)  BUN 6 - 20 mg/dL 5(L)  Creatinine 0.44 - 1.00 mg/dL 0.47  Sodium 135 - 145 mmol/L 131(L)  Potassium 3.5 - 5.1 mmol/L 4.1  Chloride 98 - 111 mmol/L 102  CO2 22 - 32 mmol/L 22  Calcium 8.9 - 10.3 mg/dL 7.6(L)  Total Protein 6.5 - 8.1 g/dL 4.8(L)  Total Bilirubin 0.3 - 1.2 mg/dL 0.7  Alkaline Phos 38 - 126 U/L 75  AST 15 - 41 U/L 18  ALT 0 - 44 U/L 11   Edinburgh Score: No flowsheet data found.    After visit meds:  Allergies as of 02/03/2021      Reactions   Sulfa Antibiotics  Hives, Rash      Medication List    STOP taking these medications   aspirin 81 MG chewable tablet     TAKE these medications   acetaminophen 500 MG tablet Commonly known as: TYLENOL Take 1,000 mg by mouth every 6 (six) hours as needed for mild pain or headache.   cholecalciferol 25 MCG (1000 UNIT) tablet Commonly known as: VITAMIN D3 Take 1,000 Units by mouth daily.   cyclobenzaprine 5 MG tablet Commonly known as: FLEXERIL Take 1 tablet (5 mg total) by mouth 3 (three) times daily as needed for muscle spasms.   famotidine 20 MG tablet Commonly known as: PEPCID Take 20 mg by mouth 2 (two) times daily.   ferrous sulfate 325 (65 FE) MG tablet Take 325 mg by mouth at bedtime.   fexofenadine 180 MG tablet Commonly known as: ALLEGRA Take 180 mg by mouth at bedtime.   folic acid 177 MCG tablet Commonly known as: FOLVITE Take 400 mcg by mouth daily.   guaiFENesin 600 MG 12 hr tablet Commonly known as: MUCINEX Take 600 mg by mouth 2 (two) times daily as needed for cough.   ibuprofen 600 MG tablet Commonly known as: ADVIL Take 1 tablet (600 mg total) by mouth every 6 (six) hours as needed for moderate pain.   omeprazole 20 MG tablet Commonly known as: PriLOSEC OTC Take 1 tablet (20 mg total) by mouth daily.   oxyCODONE-acetaminophen 5-325 MG tablet Commonly known as: Percocet Take 1-2 tablets by mouth every 6 (six) hours as needed for severe pain.   prenatal multivitamin Tabs tablet Take 1 tablet by mouth daily at 12 noon.   sertraline 100 MG tablet Commonly known as: ZOLOFT Take 100 mg by mouth at bedtime.   vitamin C 500 MG tablet Commonly known as: ASCORBIC ACID Take 500 mg by mouth daily.   zinc gluconate 50 MG tablet Take 50 mg by mouth daily.        Discharge home in stable condition Infant Feeding: Breast Infant Disposition:NICU Discharge instruction: per After Visit Summary and Postpartum booklet. Activity: Advance as tolerated. Pelvic rest for  6 weeks.  Diet: routine diet Anticipated Birth Control: BTL done PP Postpartum Appointment:1 week Additional Postpartum F/U: BP check 1 week Future Appointments: Future Appointments  Date Time Provider [place]  02/06/2021 11:15 AM [facility]  02/13/2021 11:15 AM [facility]   Follow up Visit:      02/03/2021 Anna Lex, MD

## 2021-02-04 ENCOUNTER — Ambulatory Visit: Payer: Self-pay

## 2021-02-04 NOTE — Lactation Note (Addendum)
This note was copied from a baby's chart. Lactation Consultation Note  Patient Name: Anna Park IONGE'X Date: 02/04/2021 Reason for consult: Follow-up assessment;Infant < 6lbs;Late-preterm 34-36.6wks;NICU baby;Multiple gestation Age:31 days  LC in to assist/assess positioning and latching to the breast.   Mom has been consistently pumping 8 times per 24 hrs and expressing 15 ml total per pumping. Mom has been sleeping longer stretches at night, but encouraged pumping once after 4 hrs of sleep.  Mom has pumping bands and had been using them for STS.  Baby B "Anna Park" showing some subtle cues and Mom wanting to try to latch baby.  Assisted Mom to place baby STS in football hold on right breast.  Assisted Mom with hand expressing drops of milk onto nipple.  Baby opened her mouth 3 different times and sucked a couple times before falling asleep.  Reassured Mom that baby was acting appropriately for 35 weeks.  After some time sitting on the breast with a wide open mouth, without sucking, placed baby STS on Mom's chest with band to snuggly hold baby closely.  Baby A "Anna Park" not cueing at this feeding.  Assisted FOB with holding baby STS on his chest.   Both babies have gavage feeding going during STS with parents.   Encouraged Mom to double pump after STS or breastfeeding attempts.  Goal of 8-12 pumpings per 24 hrs.  Mom provided with 2 bins to wash and dry pump parts away from sink.   Lactation Tools Discussed/Used Tools: Pump Breast pump type: Double-Electric Breast Pump Pumping frequency: Q3 Pumped volume: 15 mL  Interventions Interventions: Breast feeding basics reviewed;Assisted with latch;Skin to skin;Breast massage;Hand express;Breast compression;Adjust position;Support pillows;Position options;DEBP   Consult Status Consult Status: Follow-up Date: 02/08/21 Follow-up type: In-patient    [name] 02/04/2021, 12:26 PM

## 2021-02-05 LAB — SURGICAL PATHOLOGY

## 2021-02-06 ENCOUNTER — Other Ambulatory Visit: Payer: BC Managed Care – PPO

## 2021-02-07 ENCOUNTER — Encounter (HOSPITAL_COMMUNITY): Payer: Self-pay | Admitting: Obstetrics and Gynecology

## 2021-02-07 ENCOUNTER — Ambulatory Visit: Payer: Self-pay

## 2021-02-07 NOTE — Lactation Note (Signed)
This note was copied from a baby's chart. Lactation Consultation Note Mom pumping without difficulty and practicing lick&learn. Will plan f/u visit Saturday at 11am. Patient Name: Anna Park Today's Date: 02/07/2021 Reason for consult: NICU baby;Multiple gestation;Follow-up assessment Age:30 years  Maternal Data   Mom pumping 8x day with average yield of . Milk volume wnl.   Day 0-2 = approx. 21mL / 24 hours Day 3-7 = > / 24 hours Day 7-14 = > / 24 hours  Feeding Mother's Current Feeding Choice: Breast Milk   Lactation Tools Discussed/Used Pumping frequency: 8x Pumped volume: 90 mL   Consult Status Consult Status: Follow-up Date: 02/08/21 Follow-up type: In-patient   Elder Negus, MA IBCLC 02/07/2021, 4:25 PM

## 2021-02-08 ENCOUNTER — Ambulatory Visit: Payer: Self-pay

## 2021-02-08 NOTE — Lactation Note (Signed)
This note was copied from a baby's chart. Lactation Consultation Note  Patient Name: Anna Park [id number] Date: 02/08/2021 Reason for consult: Follow-up assessment;NICU baby;Multiple gestation;Late-preterm 34-36.6wks;Infant < 6lbs Age:31 days   LC in to assist with breastfeeding.  Mom pre-pumped a full session and obtained 90 ml.  Baby twin B [name] showing subtle feeding cues.  Baby placed STS in football hold on left breast after hand expressing drops onto nipple.  Baby opened and with assistance latched deeply onto breast.  Baby sleepy and non-nutritive except for 2 swallows during the 15 mins at the breast.  Reassured Mom that this was normal at this level of maturity.  Encouraged STS as much as possible and latching with cues.  Encouraged Mom to pump again after baby comes off the breast to support her milk supply.   LATCH Score Latch: Repeated attempts needed to sustain latch, nipple held in mouth throughout feeding, stimulation needed to elicit sucking reflex.  Audible Swallowing: A few with stimulation (2 swallows identified during feeding)  Type of Nipple: Everted at rest and after stimulation  Comfort (Breast/Nipple): Soft / non-tender  Hold (Positioning): Assistance needed to correctly position infant at breast and maintain latch.  LATCH Score: 7   Lactation Tools Discussed/Used Tools: Pump;Flanges Breast pump type: Double-Electric Breast Pump  Interventions Interventions: Breast feeding basics reviewed;Assisted with latch;Skin to skin;Breast massage;Hand express;Pre-pump if needed;Breast compression;Adjust position;Support pillows;Position options;DEBP;Education  Consult Status Consult Status: Follow-up Date: 02/14/21 Follow-up type: In-patient    [name] 02/08/2021, 12:34 PM

## 2021-02-11 ENCOUNTER — Ambulatory Visit: Payer: Self-pay

## 2021-02-11 NOTE — Lactation Note (Signed)
This note was copied from a baby's chart. Lactation Consultation Note  Patient Name: Anna Park [id number] Date: 02/11/2021 Reason for consult: NICU baby;Follow-up assessment;Multiple gestation Age:31 days  Maternal Data  Mom pumping with increasing and adequate volume. Yesterday's yield was 690 mLs. Reviewed strategies for emptying breasts and mom's comfort. Also reviewed IDF and feeding readiness. Both twins have practiced bf'ing. Will plan f/u to further assist.   Feeding Mother's Current Feeding Choice: Breast Milk   Lactation Tools Discussed/Used Pumping frequency: 8 Pumped volume: 90 mL  Interventions Interventions: Education   Consult Status Consult Status: Follow-up Follow-up type: In-patient   Elder Negus, MA IBCLC 02/11/2021, 3:16 PM

## 2021-02-13 ENCOUNTER — Other Ambulatory Visit: Payer: BC Managed Care – PPO

## 2021-02-24 ENCOUNTER — Ambulatory Visit: Payer: Self-pay

## 2021-02-24 NOTE — Lactation Note (Signed)
This note was copied from a baby's chart. Lactation Consultation Note  Patient Name: Anna Park VZSMO'L Date: 02/24/2021 Reason for consult: NICU baby Age:31 wk.o.  Lactation and lactation student Greggory Brandy) reported to Ms. Doughtie's room for follow up appointment (scheduled). Ms. Cotterman was bottle feeding baby B ([name]) at this time. Baby's are now ad lib/demand feeding and may be discharged as early as 4/20.  Ms. Baillie states that her preference is to exclusively pump and bottle feed babies. She may latch them some to help increase her milk production and to help with adapting her milk to baby's needs.  Babies are currently taking 2/ounce feeds. She is pumping 40 ounces a day. I congratulated her on achieving this volume.  We discussed how baby's intake needs will increase with growth, and I recommended that she pump every 2-3 hours at home during the day and every 3-4 hours at night. She has a Medela P.I.S. at home. We discussed rental options for a [employer] pump and I agreed to put in a [facility] referral to see if she might be eligible for a [facility] loaner pump.  I provided education on how to produce/maintain milk production and outpatient resources. I encouraged an OP follow up with S. Hice and put in a referral.  She will follow up with [facility].   Maternal Data Does the patient have breastfeeding experience prior to this delivery?: Yes  Feeding Mother's Current Feeding Choice: Breast Milk Nipple Type: Dr. Irving Burton Preemie (wide mouth)   Lactation Tools Discussed/Used Breast pump type: Double-Electric Breast Pump Reason for Pumping: mother's choice  Interventions Interventions: Education;Breast feeding basics reviewed  Discharge Pump: DEBP WIC Program: No  Consult Status Consult Status: Follow-up Date: 02/25/21 Follow-up type: In-patient    Walker Shadow 02/24/2021, 11:32 AM

## 2021-02-26 ENCOUNTER — Encounter (HOSPITAL_COMMUNITY): Payer: Self-pay | Admitting: Obstetrics and Gynecology

## 2023-07-26 ENCOUNTER — Emergency Department (HOSPITAL_BASED_OUTPATIENT_CLINIC_OR_DEPARTMENT_OTHER): Payer: BC Managed Care – PPO

## 2023-07-26 ENCOUNTER — Encounter (HOSPITAL_BASED_OUTPATIENT_CLINIC_OR_DEPARTMENT_OTHER): Payer: Self-pay | Admitting: Urology

## 2023-07-26 ENCOUNTER — Other Ambulatory Visit: Payer: Self-pay

## 2023-07-26 ENCOUNTER — Emergency Department (HOSPITAL_BASED_OUTPATIENT_CLINIC_OR_DEPARTMENT_OTHER)
Admission: EM | Admit: 2023-07-26 | Discharge: 2023-07-26 | Disposition: A | Discharge status: Home / Self Care | Payer: BC Managed Care – PPO | Attending: Emergency Medicine | Admitting: Emergency Medicine

## 2023-07-26 DIAGNOSIS — S99922A Unspecified injury of left foot, initial encounter: Secondary | ICD-10-CM | POA: Diagnosis present

## 2023-07-26 DIAGNOSIS — S92512A Displaced fracture of proximal phalanx of left lesser toe(s), initial encounter for closed fracture: Secondary | ICD-10-CM | POA: Diagnosis not present

## 2023-07-26 DIAGNOSIS — W231XXA Caught, crushed, jammed, or pinched between stationary objects, initial encounter: Secondary | ICD-10-CM | POA: Insufficient documentation

## 2023-07-26 MED ORDER — OXYCODONE HCL 5 MG PO TABS
5.0000 mg | ORAL_TABLET | Freq: Four times a day (QID) | ORAL | 0 refills | Status: AC | PRN
Start: 2023-07-26 — End: 2023-07-29

## 2023-07-26 MED ORDER — OXYCODONE-ACETAMINOPHEN 5-325 MG PO TABS
1.0000 | ORAL_TABLET | ORAL | Status: DC | PRN
  Administered 2023-07-26: 1 via ORAL
  Filled 2023-07-26: qty 1

## 2023-07-26 NOTE — Discharge Instructions (Addendum)
You were seen in the emergency room today for fracture.  Please continue to buddy tape fourth and third toe together.  Wear postop shoe.  And use crutches.  You can take pain medicine as needed, alternating ibuprofen and Tylenol. You can also apply ice to area as well.  Call ortho and schedule a follow up for toe fracture.   Return to the ER with new or worsening symptoms.

## 2023-07-26 NOTE — ED Provider Notes (Signed)
[facility] Provider Note   CSN: [id number] Arrival date & time: 07/26/23  1900     History  Chief Complaint  Patient presents with   Toe Dislocation     Stellarose Yapp is a 32 y.o. female w/ pmhx of anxiety and gerd presenting after she jammed her toe against a wall.  Patient has never had surgery on this foot ankle or toe before.  Patient had immediate pain and noticed immediate angulation of fourth digit after jamming her toe.  Patient has not taken anything for pain.  Reports she was able to weight-bear on heel but it caused significant pain of fourth digit.  HPI     Home Medications Prior to Admission medications   Medication Sig Start Date End Date Taking? Authorizing Provider  acetaminophen (TYLENOL) 500 MG tablet Take 1,000 mg by mouth every 6 (six) hours as needed for mild pain or headache.    [provider]  cholecalciferol (VITAMIN D3) 25 MCG (1000 UNIT) tablet Take 1,000 Units by mouth daily.    [provider]  cyclobenzaprine (FLEXERIL) 5 MG tablet Take 1 tablet (5 mg total) by mouth 3 (three) times daily as needed for muscle spasms. 01/27/21   Ranae Pila, MD  famotidine (PEPCID) 20 MG tablet Take 20 mg by mouth 2 (two) times daily.    [provider]  ferrous sulfate 325 (65 FE) MG tablet Take 325 mg by mouth at bedtime.    [provider]  fexofenadine (ALLEGRA) 180 MG tablet Take 180 mg by mouth at bedtime.    [provider]  folic acid (FOLVITE) 400 MCG tablet Take 400 mcg by mouth daily.    [provider]  guaiFENesin (MUCINEX) 600 MG 12 hr tablet Take 600 mg by mouth 2 (two) times daily as needed for cough.    [provider]  ibuprofen (ADVIL) 600 MG tablet Take 1 tablet (600 mg total) by mouth every 6 (six) hours as needed for moderate pain. 02/03/21   Candice Camp, MD  omeprazole (PRILOSEC OTC) 20 MG tablet Take 1 tablet (20 mg total) by  mouth daily. 01/27/21   Ranae Pila, MD  oxyCODONE-acetaminophen (PERCOCET) 5-325 MG tablet Take 1-2 tablets by mouth every 6 (six) hours as needed for severe pain. 02/03/21   Candice Camp, MD  Prenatal Vit-Fe Fumarate-FA (PRENATAL MULTIVITAMIN) TABS tablet Take 1 tablet by mouth daily at 12 noon.     [provider]  sertraline (ZOLOFT) 100 MG tablet Take 100 mg by mouth at bedtime.    [provider]  vitamin C (ASCORBIC ACID) 500 MG tablet Take 500 mg by mouth daily.    [provider]  zinc gluconate 50 MG tablet Take 50 mg by mouth daily.    [provider]      Allergies    Sulfa antibiotics    Review of Systems   Review of Systems  Musculoskeletal:        Toe pain    Physical Exam Updated Vital Signs BP 130/86 (BP Location: Left Arm)   Pulse 81   Temp 98 F (36.7 C)   Resp 18   Ht 5\' 5"  (1.651 m)   Wt 93.6 kg   SpO2 100%   BMI 34.34 kg/m  Physical Exam Vitals and nursing note reviewed.  Constitutional:      General: She is not in acute distress.    Appearance: She is not toxic-appearing.  HENT:     Head: Normocephalic and atraumatic.  Eyes:     General: No scleral icterus.    Conjunctiva/sclera: Conjunctivae normal.  Cardiovascular:     Rate and Rhythm: Normal rate and regular rhythm.     Pulses: Normal pulses.     Heart sounds: Normal heart sounds.  Pulmonary:     Effort: Pulmonary effort is normal. No respiratory distress.     Breath sounds: Normal breath sounds.  Abdominal:     General: Abdomen is flat. Bowel sounds are normal.     Palpations: Abdomen is soft.     Tenderness: There is no abdominal tenderness.  Genitourinary:    Comments: 4th proximal phalanx deformity. No open skin, neurovascularly intact.  Skin:    General: Skin is warm and dry.     Findings: No lesion.  Neurological:     General: No focal deficit present.     Mental Status: She is alert and oriented to person, place, and time. Mental  status is at baseline.     ED Results / Procedures / Treatments   Labs (all labs ordered are listed, but only abnormal results are displayed) Labs Reviewed - No data to display  EKG None  Radiology DG Toe 4th Left  Result Date: 07/26/2023 CLINICAL DATA:  Toe dislocation, deformity. EXAM: LEFT FOURTH TOE COMPARISON:  None Available. FINDINGS: Oblique mildly displaced fracture of the fourth toe proximal phalanx. Apex medial angulation. No intra-articular involvement. No joint dislocation. Distal digit is intact. Soft tissue edema. IMPRESSION: Mildly displaced and angulated fourth toe proximal phalanx fracture. Electronically Signed   By: Narda Rutherford M.D.   On: 07/26/2023 19:45    Procedures Procedures    Medications Ordered in ED Medications  oxyCODONE-acetaminophen (PERCOCET/ROXICET) 5-325 MG per tablet 1 tablet (1 tablet Oral Given 07/26/23 2019)    ED Course/ Medical Decision Making/ A&P                                 Medical Decision Making Amount and/or Complexity of Data Reviewed Radiology: ordered.  Risk Prescription drug management.   This patient presents to the ED for concern of fracture fourth phalanx left, this involves an extensive number of treatment options, and is a complaint that carries with it a high risk of complications and morbidity.  The differential diagnosis includes dislocation, fracture, gout, open fracture, laceration, sprain    Co morbidities that complicate the patient evaluation  Anxiety    Additional history obtained:  Additional history obtained from husband at bedside   Lab Tests:  none   Imaging Studies ordered:  I ordered imaging studies including xray 4th proximal phalanx with mild displacement and angulation  Dr Earlene Plater attempted realigning 4th digit, repeat xrays of foot were ordered to re-eval angulation  Reimaging of the foot shows improved angulation of fourth proximal phalanx.   Cardiac Monitoring: /  EKG:  None, vitals stable    Consultations Obtained:  None   Problem List / ED Course / Critical interventions / Medication management  Presenting after jamming left fourth digit against a wall.  Patient had immediate pain after jamming her foot and that she had angulation of fourth digit. No prior fractures or injury to foot in the past. Pt is neurovascularly intact and able to move her 4th digit. After relocation of toe, patient is reporting improved sx.  I ordered medication including Oxycodone  for pain  Reevaluation of  the patient after these medicines showed that the patient improved I have reviewed the patients home medicines and have made adjustments as needed   Plan Postop shoe and crutches.  Alternate Tylenol and ibuprofen for pain control.  Oxycodone for breakthrough pain.  Ice to area. Call and schedule appointment to follow-up with orthopedics. Patient stable for discharge agreeable to plan and understands. Turn to emergency room with new or worsening symptoms.        Final Clinical Impression(s) / ED Diagnoses Final diagnoses:  None    Rx / DC Orders ED Discharge Orders     None         Raford Pitcher Evalee Jefferson 07/26/23 2215    Laurence Spates, MD 07/28/23 [id number]

## 2023-07-26 NOTE — ED Triage Notes (Signed)
Pt jammed left 4th toe on wall, obvious dislocation
